# Patient Record
Sex: Female | Born: 1960 | Race: White | Hispanic: No | Marital: Married | Smoking: Former smoker
Health system: Southern US, Community
[De-identification: ages and names within clinical notes are randomized; demographics above are authoritative.]

## PROBLEM LIST (undated history)

## (undated) DIAGNOSIS — E0500 Thyrotoxicosis with diffuse goiter without thyrotoxic crisis or storm: Secondary | ICD-10-CM

## (undated) DIAGNOSIS — K529 Noninfective gastroenteritis and colitis, unspecified: Secondary | ICD-10-CM

## (undated) HISTORY — PX: ABDOMINAL HYSTERECTOMY: SHX81

## (undated) HISTORY — PX: OTHER SURGICAL HISTORY: SHX169

---

## 1997-11-23 ENCOUNTER — Other Ambulatory Visit: Admission: RE | Admit: 1997-11-23 | Discharge: 1997-11-23 | Payer: Self-pay | Admitting: Internal Medicine

## 1999-08-07 ENCOUNTER — Encounter: Admission: RE | Admit: 1999-08-07 | Discharge: 1999-11-05 | Payer: Self-pay | Admitting: *Deleted

## 1999-11-06 ENCOUNTER — Encounter: Payer: Self-pay | Admitting: Orthopedic Surgery

## 1999-11-06 ENCOUNTER — Encounter: Admission: RE | Admit: 1999-11-06 | Discharge: 2000-02-04 | Payer: Self-pay | Admitting: Orthopedic Surgery

## 1999-11-08 ENCOUNTER — Encounter: Admission: RE | Admit: 1999-11-08 | Discharge: 2000-02-06 | Payer: Self-pay | Admitting: *Deleted

## 2000-10-07 ENCOUNTER — Encounter: Admission: RE | Admit: 2000-10-07 | Discharge: 2001-01-05 | Payer: Self-pay | Admitting: *Deleted

## 2001-08-26 ENCOUNTER — Encounter: Admission: RE | Admit: 2001-08-26 | Discharge: 2001-11-24 | Payer: Self-pay | Admitting: Internal Medicine

## 2001-12-15 ENCOUNTER — Encounter: Admission: RE | Admit: 2001-12-15 | Discharge: 2002-02-23 | Payer: Self-pay | Admitting: Internal Medicine

## 2008-03-25 ENCOUNTER — Emergency Department (HOSPITAL_BASED_OUTPATIENT_CLINIC_OR_DEPARTMENT_OTHER): Admission: EM | Admit: 2008-03-25 | Discharge: 2008-03-25 | Payer: Self-pay | Admitting: Emergency Medicine

## 2008-12-01 DIAGNOSIS — E890 Postprocedural hypothyroidism: Secondary | ICD-10-CM | POA: Insufficient documentation

## 2009-11-20 DIAGNOSIS — E7800 Pure hypercholesterolemia, unspecified: Secondary | ICD-10-CM | POA: Insufficient documentation

## 2009-11-20 DIAGNOSIS — E109 Type 1 diabetes mellitus without complications: Secondary | ICD-10-CM | POA: Insufficient documentation

## 2010-09-28 NOTE — Consult Note (Signed)
[place]. [facility]  Patient:    Jody Cooke, Jody Cooke                            MRN: [id number] Proc. Date: 11/06/99 Attending:  Barry Dienes. Eloise Harman, M.D. Dictator:   [id number] CC:         Fritzi Mandes, M.D.                          Consultation Report  REASON FOR CONSULTATION: Right foot pain in a patient with diabetes mellitus.  HISTORY OF PRESENT ILLNESS: The patient is a 50 year old white female with diabetes mellitus, type 1, since approximately 1965.  For the past six months she has complained of pain in the right heel that is worse when first waking up in the morning and worse with walking, particularly on bare feet.  She had been seeing a chiropractor, whose treatment helped a bit.  She has not been taking any anti-inflammatory medications for this condition.  PAST MEDICAL HISTORY:  1. Graves disease, status post radioactive iodine treatment.  2. Irritable bowel syndrome.  3. Pudendal nerve injury.  4. Hypothyroidism.  5. Hyperlipidemia.  PAST SURGICAL HISTORY:  1. Facial cyst removal in 1982.  2. Exploratory laparotomy for endometriosis, approximately 1990.  3. Left shoulder surgery approximately 1990.  4. Left eyelid surgery due to Graves disease in 1996.  5. Colonoscopy in June 2001.  ALLERGIES:  1. ASPIRIN.  2. PENICILLIN.  3. MULTIPLE AERO-ALLERGENS.  4. SEVERAL FOODS.  MEDICATIONS:  1. Humulin NPH at bedtime, with sliding-scale Humalog insulin t.i.d. with     meals.  2. Synthroid 0.2 mg p.o. q.d.  3. Paxil 10 mg p.o. q.d.  4. Hyoscyamine 0.125 mg p.o. q.d. p.r.n. abdominal pain.  5. Compazine 10 mg p.o. q.d. p.r.n. nausea and vomiting.  6. Darvocet p.r.n. pain.  7. Lipitor 10 mg p.o. q.d.  8. Centrum multivitamins 2 tablets p.o. q.d.  9. - eyedrops q.d.  PHYSICAL EXAMINATION: The shape and temperature of both feet are normal. Dorsalis pedis pulses are normal bilaterally, although posterior tibial pulses are nonpalpable.  There is  onychomycosis effecting multiple toenails.  There is no evidence of ingrown toenail.  There is pain in the medial plantar aspect of the right heel that is worse with deep palpation and consistent with plantar fasciitis.  RECOMMENDATIONS:  1. X-ray of the right heel to rule out a stress fracture was done but showed     no evidence of fracture.  2. She was given instructions on proper means of stretching the plantar     fascia.  3. She was advised to purchase heel cups at a sporting good store to lessen     the pressure on the plantar fascia.  4. She was also recommended to consider putting comfortable walking shoes at     her bedside to be put on as soon as she wakes up.  5. If her symptoms persist or worsen with the above, then she was advised to     consider a local steroid injection into the plantar fascia. DD:  11/06/99 TD:  11/06/99 Job: [id number]

## 2011-02-13 LAB — GLUCOSE, CAPILLARY: Glucose-Capillary: 246 — ABNORMAL HIGH

## 2011-04-11 ENCOUNTER — Emergency Department (HOSPITAL_BASED_OUTPATIENT_CLINIC_OR_DEPARTMENT_OTHER)
Admission: EM | Admit: 2011-04-11 | Discharge: 2011-04-11 | Disposition: A | Discharge status: Home / Self Care | Payer: PRIVATE HEALTH INSURANCE | Attending: Emergency Medicine | Admitting: Emergency Medicine

## 2011-04-11 DIAGNOSIS — Z79899 Other long term (current) drug therapy: Secondary | ICD-10-CM | POA: Insufficient documentation

## 2011-04-11 DIAGNOSIS — S91209A Unspecified open wound of unspecified toe(s) with damage to nail, initial encounter: Secondary | ICD-10-CM

## 2011-04-11 DIAGNOSIS — S91109A Unspecified open wound of unspecified toe(s) without damage to nail, initial encounter: Secondary | ICD-10-CM | POA: Insufficient documentation

## 2011-04-11 DIAGNOSIS — R197 Diarrhea, unspecified: Secondary | ICD-10-CM | POA: Insufficient documentation

## 2011-04-11 DIAGNOSIS — L602 Onychogryphosis: Secondary | ICD-10-CM

## 2011-04-11 DIAGNOSIS — E119 Type 2 diabetes mellitus without complications: Secondary | ICD-10-CM | POA: Insufficient documentation

## 2011-04-11 DIAGNOSIS — L608 Other nail disorders: Secondary | ICD-10-CM | POA: Insufficient documentation

## 2011-04-11 DIAGNOSIS — X58XXXA Exposure to other specified factors, initial encounter: Secondary | ICD-10-CM | POA: Insufficient documentation

## 2011-04-11 HISTORY — DX: Thyrotoxicosis with diffuse goiter without thyrotoxic crisis or storm: E05.00

## 2011-04-11 HISTORY — DX: Noninfective gastroenteritis and colitis, unspecified: K52.9

## 2011-04-11 MED ORDER — HYDROCODONE-ACETAMINOPHEN 5-325 MG PO TABS: 2.0000 | ORAL_TABLET | ORAL | Status: AC | PRN

## 2011-04-11 MED ORDER — LIDOCAINE HCL 2 % IJ SOLN
INTRAMUSCULAR | Status: AC
  Filled 2011-04-11: qty 1

## 2011-04-11 MED ORDER — LIDOCAINE HCL 2 % IJ SOLN
20.0000 mL | Freq: Once | INTRAMUSCULAR | Status: AC
  Administered 2011-04-11: 400 mg via INTRADERMAL

## 2011-04-11 MED ORDER — CEPHALEXIN 500 MG PO CAPS: 500.0000 mg | ORAL_CAPSULE | Freq: Four times a day (QID) | ORAL | Status: AC

## 2011-04-11 NOTE — ED Provider Notes (Signed)
History     CSN: [id number] Arrival date & time: 04/11/2011  5:45 PM   First MD Initiated Contact with Patient 04/11/11 1804      Chief Complaint  Patient presents with  . Nail Problem    (Consider location/radiation/quality/duration/timing/severity/associated sxs/prior treatment) Patient is a 50 y.o. female presenting with toe pain. The history is provided by the patient. No language interpreter was used.  Toe Pain This is a chronic problem. The current episode started more than 1 month ago. The problem occurs constantly. The problem has been gradually worsening. Pertinent negatives include no fever or joint swelling. The symptoms are aggravated by walking and standing. She has tried rest for the symptoms. The treatment provided no relief.  Pt reports she has had a discolored toenail for over a year.  Pt has been using vicks for fungal infection.  Pt reports she hit toe 2 months ago.  Pt reports now nail is pulling away and is discolored.    Past Medical History  Diagnosis Date  . Diabetes mellitus   . Grave's disease   . Chronic diarrhea     Past Surgical History  Procedure Date  . Eye lid surgery     No family history on file.  History  Substance Use Topics  . Smoking status: Never Smoker   . Smokeless tobacco: Not on file  . Alcohol Use: No    OB History    Grav Para Term Preterm Abortions TAB SAB Ect Mult Living                  Review of Systems  Constitutional: Negative for fever.  Musculoskeletal: Negative for joint swelling and gait problem.  All other systems reviewed and are negative.    Allergies  Aspirin and Penicillins  Home Medications   Current Outpatient Rx  Name Route Sig Dispense Refill  . SUPER B COMPLEX PO Oral Take 6-8 tablets by mouth daily.     Marland Kitchen CETIRIZINE HCL 10 MG PO TABS Oral Take 10 mg by mouth daily.      Marland Kitchen VITAMIN D 2000 UNITS PO TABS Oral Take 2,000 Units by mouth daily.      Marland Kitchen DIPHENOXYLATE-ATROPINE 2.5-0.025 MG PO TABS  Oral Take 1 tablet by mouth 4 (four) times daily as needed. For diarrhea     . IBUPROFEN 200 MG PO CAPS Oral Take 1 capsule by mouth every 6 (six) hours as needed. For cramps    . INSULIN DETEMIR 100 UNIT/ML [place] SOLN Subcutaneous Inject 25.5 Units into the skin daily.     . INSULIN LISPRO (HUMAN) 100 UNIT/ML [place] SOLN Subcutaneous Inject 5-11 Units into the skin 4 (four) times daily -  before meals and at bedtime.     Marland Kitchen LEVOTHYROXINE SODIUM 175 MCG PO TABS Oral Take 175 mcg by mouth daily.      . MOMETASONE FUROATE 0.1 % EX CREA Topical Apply 1 application topically daily.      [name] PO TABS Oral Take 2 tablets by mouth daily.     Marland Kitchen SIMVASTATIN 20 MG PO TABS Oral Take 20 mg by mouth daily.     Marland Kitchen CIPROFLOXACIN HCL PO Oral Take 1 tablet by mouth 2 (two) times daily as needed. For UTI       BP 142/72  Pulse 95  Temp(Src) 98.5 F (36.9 C) (Oral)  Resp 16  Ht 5\' 8"  (1.727 m)  Wt 195 lb (88.451 kg)  BMI 29.65 kg/m2  SpO2  98%  LMP 04/02/2011  Physical Exam  Nursing note and vitals reviewed. Constitutional: She appears well-developed and well-nourished.  Neck: Normal range of motion.  Musculoskeletal:       Partially avulsed nail  Right toe,  Loose hanging.  Yellow discolored  Neurological: She is alert.  Skin: Skin is warm.  Psychiatric: She has a normal mood and affect.    ED Course  NAIL REMOVAL Date/Time: 04/11/2011 7:31 PM Performed by: Langston Masker Authorized by: Langston Masker Consent: Verbal consent obtained. Consent given by: patient Patient understanding: patient states understanding of the procedure being performed Location: right foot Anesthesia: digital block Local anesthetic: lidocaine 2% without epinephrine Patient sedated: no Preparation: skin prepped with Betadine and skin prepped with ChloraPrep Removed nail replaced and anchored: no Patient tolerance: Patient tolerated the procedure well with no immediate complications. Comments: Nail hanging by lateral  aspect,  Nail not attached to nail bed,  No bleeding no trauma,  There is no new nail.     (including critical care time)  Labs Reviewed - No data to display No results found.   No diagnosis found.    MDM     I referred pt to Dr. Charlsie Merles,  I suspect this nail has been traumatized so bad that it will not regrow.  i am going to treat with antibiotics.    Langston Masker, [place] 04/11/11 [phone]

## 2011-04-11 NOTE — ED Notes (Signed)
Right great toe nail injured 2 months ago-hx of fungal infection-c/o white in color with detatched nail noticed last night

## 2011-04-20 NOTE — ED Provider Notes (Signed)
Medical screening examination/treatment/procedure(s) were performed by non-physician practitioner and as supervising physician I was immediately available for consultation/collaboration.   Suzi Roots, MD 04/20/11 [phone]

## 2011-12-23 ENCOUNTER — Emergency Department (HOSPITAL_BASED_OUTPATIENT_CLINIC_OR_DEPARTMENT_OTHER): Payer: Medicare Other

## 2011-12-23 ENCOUNTER — Encounter (HOSPITAL_BASED_OUTPATIENT_CLINIC_OR_DEPARTMENT_OTHER): Payer: Self-pay | Admitting: *Deleted

## 2011-12-23 ENCOUNTER — Emergency Department (HOSPITAL_BASED_OUTPATIENT_CLINIC_OR_DEPARTMENT_OTHER)
Admission: EM | Admit: 2011-12-23 | Discharge: 2011-12-23 | Disposition: A | Discharge status: Home / Self Care | Payer: Medicare Other | Attending: Emergency Medicine | Admitting: Emergency Medicine

## 2011-12-23 DIAGNOSIS — E119 Type 2 diabetes mellitus without complications: Secondary | ICD-10-CM | POA: Insufficient documentation

## 2011-12-23 DIAGNOSIS — R079 Chest pain, unspecified: Secondary | ICD-10-CM | POA: Insufficient documentation

## 2011-12-23 DIAGNOSIS — Z794 Long term (current) use of insulin: Secondary | ICD-10-CM | POA: Insufficient documentation

## 2011-12-23 DIAGNOSIS — R42 Dizziness and giddiness: Secondary | ICD-10-CM | POA: Insufficient documentation

## 2011-12-23 LAB — COMPREHENSIVE METABOLIC PANEL
CO2: 25 mEq/L (ref 19–32)
Calcium: 9.3 mg/dL (ref 8.4–10.5)
Creatinine, Ser: 0.7 mg/dL (ref 0.50–1.10)
GFR calc Af Amer: >90 mL/min (ref >90)
GFR calc non Af Amer: >90 mL/min (ref >90)
Glucose, Bld: 128 mg/dL — ABNORMAL HIGH (ref 70–99)

## 2011-12-23 LAB — CBC
Hemoglobin: 11.4 g/dL — ABNORMAL LOW (ref 12.0–15.0)
MCH: 31.8 pg (ref 26.0–34.0)
MCHC: 33.5 g/dL (ref 30.0–36.0)
MCV: 95 fL (ref 78.0–100.0)
Platelets: 305 10*3/uL (ref 150–400)
RBC: 3.58 MIL/uL — ABNORMAL LOW (ref 3.87–5.11)

## 2011-12-23 NOTE — ED Notes (Signed)
Care assumed

## 2011-12-23 NOTE — ED Provider Notes (Signed)
History  This chart was scribed for Charles B. Bernette Mayers, MD by Ladona Ridgel Day. This patient was seen in room MH01/MH01 and the patient's care was started at 1523.   CSN: [id number]  Arrival date & time 12/23/11  1523   First MD Initiated Contact with Patient 12/23/11 1555      Chief Complaint  Patient presents with  . Chest Pain    The history is provided by the patient. No language interpreter was used.   Jody Cooke is a 51 y.o. female who presents to the Emergency Department complaining of constant mid sternal chest pain/tightness for four days. She states her chest pain is worse with walking around and was max at 7/10 in pain and her pain sometimes makes her dizzy. She states her CP does not radiate and denies any leg swelling or recent long travels. She denies any previous heart history but did have a normal EKG before her D&C procedure one month ago. She denies ever having a stress test or heart catheterization. She is not a smoker. Pain has improved over the course of several days and is absent at the time of my evaluation.   Her PCP is Dr. Claudean Cooke at [facility] Past Medical History  Diagnosis Date  . Diabetes mellitus   . Grave's disease   . Chronic diarrhea     Past Surgical History  Procedure Date  . Eye lid surgery     History reviewed. No pertinent family history.  History  Substance Use Topics  . Smoking status: Never Smoker   . Smokeless tobacco: Not on file  . Alcohol Use: No    OB History    Grav Para Term Preterm Abortions TAB SAB Ect Mult Living                  Review of Systems A complete 10 system review of systems was obtained and all systems are negative except as noted in the HPI and PMH.   Allergies  Aspirin and Penicillins  Home Medications   Current Outpatient Rx  Name Route Sig Dispense Refill  . SUPER B COMPLEX PO Oral Take 6-8 tablets by mouth daily.     Marland Kitchen CETIRIZINE HCL 10 MG PO TABS Oral Take 10 mg by mouth daily.      Marland Kitchen VITAMIN D  2000 UNITS PO TABS Oral Take 2,000 Units by mouth daily.      Marland Kitchen DIPHENOXYLATE-ATROPINE 2.5-0.025 MG PO TABS Oral Take 1 tablet by mouth 4 (four) times daily as needed. For diarrhea     . IBUPROFEN 200 MG PO CAPS Oral Take 1 capsule by mouth every 6 (six) hours as needed. For cramps    . INSULIN DETEMIR 100 UNIT/ML [place] SOLN Subcutaneous Inject 25.5 Units into the skin daily.     . INSULIN LISPRO (HUMAN) 100 UNIT/ML [place] SOLN Subcutaneous Inject 5-11 Units into the skin 4 (four) times daily -  before meals and at bedtime.     Marland Kitchen LEVOTHYROXINE SODIUM 175 MCG PO TABS Oral Take 175 mcg by mouth daily.      . MOMETASONE FUROATE 0.1 % EX CREA Topical Apply 1 application topically daily.      [name] PO TABS Oral Take 2 tablets by mouth daily.     Marland Kitchen SIMVASTATIN 20 MG PO TABS Oral Take 20 mg by mouth daily.       Triage Vitals: BP 143/69  Pulse 104  Temp 98.2 F (36.8 C) (Oral)  Resp 16  Ht 5\' 10"  (1.778 m)  Wt 186 lb (84.369 kg)  BMI 26.69 kg/m2  SpO2 99%  LMP 12/09/2011  Physical Exam  Nursing note and vitals reviewed. Constitutional: She is oriented to person, place, and time. She appears well-developed and well-nourished.  HENT:  Head: Normocephalic and atraumatic.  Eyes: EOM are normal. Pupils are equal, round, and reactive to light.  Neck: Normal range of motion. Neck supple.  Cardiovascular: Normal rate, normal heart sounds and intact distal pulses.   Pulmonary/Chest: Effort normal and breath sounds normal.  Abdominal: Bowel sounds are normal. She exhibits no distension. There is no tenderness.  Musculoskeletal: Normal range of motion. She exhibits no edema and no tenderness.  Neurological: She is alert and oriented to person, place, and time. She has normal strength. No cranial nerve deficit or sensory deficit.  Skin: Skin is warm and dry. No rash noted.  Psychiatric: She has a normal mood and affect.    ED Course  Procedures (including critical care time) DIAGNOSTIC  STUDIES: Oxygen Saturation is 99% on room air, normal by my interpretation.    COORDINATION OF CARE: At 405 PM Discussed treatment plan with patient which includes CXR, heart markers, and blood work. Patient agrees.   Labs Reviewed  CBC - Abnormal; Notable for the following:    RBC 3.58 (*)     Hemoglobin 11.4 (*)     HCT 34.0 (*)     All other components within normal limits  TROPONIN I  COMPREHENSIVE METABOLIC PANEL   No results found.   No diagnosis found.    MDM  I personally performed the services described in the documentation, which were scribed in my presence. The recorded information has been reviewed and considered.   Date: 12/23/2011  Rate: 95  Rhythm: normal sinus rhythm  QRS Axis: normal  Intervals: normal  ST/T Wave abnormalities: normal  Conduction Disutrbances:none  Narrative Interpretation:   Old EKG Reviewed: none available  Discussed case with PCP, Dr. Riley Nearing who agrees with plan for delta troponin in the ED and outpatient followup if neg. Pt has had pain for several days, now resolved, neg enzymes x 2 are effective for rule out. Pt safe for outpatient followup with PCP for further evaluation. Pt amenable to this plan, advised to return to the ED for any worsening pain, or for any other concerns.           Charles B. Bernette Mayers, MD 12/23/11 Rickey Primus

## 2011-12-23 NOTE — ED Notes (Signed)
Pt c/o CP " tightness" x 1 week

## 2015-05-30 DIAGNOSIS — M542 Cervicalgia: Secondary | ICD-10-CM | POA: Diagnosis not present

## 2015-05-30 DIAGNOSIS — M9901 Segmental and somatic dysfunction of cervical region: Secondary | ICD-10-CM | POA: Diagnosis not present

## 2015-06-23 DIAGNOSIS — N3289 Other specified disorders of bladder: Secondary | ICD-10-CM | POA: Diagnosis not present

## 2015-06-27 DIAGNOSIS — M9901 Segmental and somatic dysfunction of cervical region: Secondary | ICD-10-CM | POA: Diagnosis not present

## 2015-06-27 DIAGNOSIS — M542 Cervicalgia: Secondary | ICD-10-CM | POA: Diagnosis not present

## 2015-06-29 DIAGNOSIS — M545 Low back pain: Secondary | ICD-10-CM | POA: Diagnosis not present

## 2015-06-29 DIAGNOSIS — N3000 Acute cystitis without hematuria: Secondary | ICD-10-CM | POA: Diagnosis not present

## 2015-07-20 DIAGNOSIS — E109 Type 1 diabetes mellitus without complications: Secondary | ICD-10-CM | POA: Diagnosis not present

## 2015-07-25 DIAGNOSIS — E890 Postprocedural hypothyroidism: Secondary | ICD-10-CM | POA: Diagnosis not present

## 2015-07-25 DIAGNOSIS — E109 Type 1 diabetes mellitus without complications: Secondary | ICD-10-CM | POA: Diagnosis not present

## 2015-07-25 DIAGNOSIS — E7800 Pure hypercholesterolemia, unspecified: Secondary | ICD-10-CM | POA: Diagnosis not present

## 2015-09-05 DIAGNOSIS — S8992XA Unspecified injury of left lower leg, initial encounter: Secondary | ICD-10-CM | POA: Diagnosis not present

## 2015-09-05 DIAGNOSIS — M79672 Pain in left foot: Secondary | ICD-10-CM | POA: Diagnosis not present

## 2015-09-05 DIAGNOSIS — M25561 Pain in right knee: Secondary | ICD-10-CM | POA: Diagnosis not present

## 2015-09-11 DIAGNOSIS — M25561 Pain in right knee: Secondary | ICD-10-CM | POA: Diagnosis not present

## 2015-09-11 DIAGNOSIS — G5761 Lesion of plantar nerve, right lower limb: Secondary | ICD-10-CM | POA: Diagnosis not present

## 2015-09-11 DIAGNOSIS — M7061 Trochanteric bursitis, right hip: Secondary | ICD-10-CM | POA: Diagnosis not present

## 2015-09-11 DIAGNOSIS — M79672 Pain in left foot: Secondary | ICD-10-CM | POA: Diagnosis not present

## 2015-09-28 DIAGNOSIS — K6289 Other specified diseases of anus and rectum: Secondary | ICD-10-CM | POA: Diagnosis not present

## 2015-09-28 DIAGNOSIS — K529 Noninfective gastroenteritis and colitis, unspecified: Secondary | ICD-10-CM | POA: Diagnosis not present

## 2015-09-29 DIAGNOSIS — M25561 Pain in right knee: Secondary | ICD-10-CM | POA: Diagnosis not present

## 2015-09-29 DIAGNOSIS — N390 Urinary tract infection, site not specified: Secondary | ICD-10-CM | POA: Diagnosis not present

## 2015-10-10 DIAGNOSIS — H524 Presbyopia: Secondary | ICD-10-CM | POA: Diagnosis not present

## 2015-10-10 DIAGNOSIS — E109 Type 1 diabetes mellitus without complications: Secondary | ICD-10-CM | POA: Diagnosis not present

## 2015-10-10 DIAGNOSIS — H2513 Age-related nuclear cataract, bilateral: Secondary | ICD-10-CM | POA: Diagnosis not present

## 2015-10-24 DIAGNOSIS — M9911 Subluxation complex (vertebral) of cervical region: Secondary | ICD-10-CM | POA: Diagnosis not present

## 2015-10-24 DIAGNOSIS — M25552 Pain in left hip: Secondary | ICD-10-CM | POA: Diagnosis not present

## 2015-10-24 DIAGNOSIS — M9903 Segmental and somatic dysfunction of lumbar region: Secondary | ICD-10-CM | POA: Diagnosis not present

## 2015-10-24 DIAGNOSIS — M62830 Muscle spasm of back: Secondary | ICD-10-CM | POA: Diagnosis not present

## 2015-10-24 DIAGNOSIS — M542 Cervicalgia: Secondary | ICD-10-CM | POA: Diagnosis not present

## 2015-10-24 DIAGNOSIS — M25551 Pain in right hip: Secondary | ICD-10-CM | POA: Diagnosis not present

## 2015-10-26 DIAGNOSIS — M542 Cervicalgia: Secondary | ICD-10-CM | POA: Diagnosis not present

## 2015-10-26 DIAGNOSIS — M62830 Muscle spasm of back: Secondary | ICD-10-CM | POA: Diagnosis not present

## 2015-10-26 DIAGNOSIS — M25551 Pain in right hip: Secondary | ICD-10-CM | POA: Diagnosis not present

## 2015-10-26 DIAGNOSIS — M25552 Pain in left hip: Secondary | ICD-10-CM | POA: Diagnosis not present

## 2015-10-26 DIAGNOSIS — M9911 Subluxation complex (vertebral) of cervical region: Secondary | ICD-10-CM | POA: Diagnosis not present

## 2015-10-26 DIAGNOSIS — M9903 Segmental and somatic dysfunction of lumbar region: Secondary | ICD-10-CM | POA: Diagnosis not present

## 2015-11-02 DIAGNOSIS — M62830 Muscle spasm of back: Secondary | ICD-10-CM | POA: Diagnosis not present

## 2015-11-02 DIAGNOSIS — M25552 Pain in left hip: Secondary | ICD-10-CM | POA: Diagnosis not present

## 2015-11-02 DIAGNOSIS — M9903 Segmental and somatic dysfunction of lumbar region: Secondary | ICD-10-CM | POA: Diagnosis not present

## 2015-11-02 DIAGNOSIS — M9911 Subluxation complex (vertebral) of cervical region: Secondary | ICD-10-CM | POA: Diagnosis not present

## 2015-11-02 DIAGNOSIS — M25551 Pain in right hip: Secondary | ICD-10-CM | POA: Diagnosis not present

## 2015-11-02 DIAGNOSIS — M542 Cervicalgia: Secondary | ICD-10-CM | POA: Diagnosis not present

## 2015-11-07 DIAGNOSIS — E109 Type 1 diabetes mellitus without complications: Secondary | ICD-10-CM | POA: Diagnosis not present

## 2015-11-07 DIAGNOSIS — E890 Postprocedural hypothyroidism: Secondary | ICD-10-CM | POA: Diagnosis not present

## 2015-11-07 DIAGNOSIS — E7800 Pure hypercholesterolemia, unspecified: Secondary | ICD-10-CM | POA: Diagnosis not present

## 2015-11-09 DIAGNOSIS — M25552 Pain in left hip: Secondary | ICD-10-CM | POA: Diagnosis not present

## 2015-11-09 DIAGNOSIS — M9911 Subluxation complex (vertebral) of cervical region: Secondary | ICD-10-CM | POA: Diagnosis not present

## 2015-11-09 DIAGNOSIS — M542 Cervicalgia: Secondary | ICD-10-CM | POA: Diagnosis not present

## 2015-11-09 DIAGNOSIS — M25551 Pain in right hip: Secondary | ICD-10-CM | POA: Diagnosis not present

## 2015-11-09 DIAGNOSIS — M9903 Segmental and somatic dysfunction of lumbar region: Secondary | ICD-10-CM | POA: Diagnosis not present

## 2015-11-09 DIAGNOSIS — M62830 Muscle spasm of back: Secondary | ICD-10-CM | POA: Diagnosis not present

## 2015-11-16 DIAGNOSIS — M5416 Radiculopathy, lumbar region: Secondary | ICD-10-CM | POA: Diagnosis not present

## 2015-11-16 DIAGNOSIS — M545 Low back pain: Secondary | ICD-10-CM | POA: Diagnosis not present

## 2015-11-28 DIAGNOSIS — Z1231 Encounter for screening mammogram for malignant neoplasm of breast: Secondary | ICD-10-CM | POA: Diagnosis not present

## 2015-11-30 DIAGNOSIS — M542 Cervicalgia: Secondary | ICD-10-CM | POA: Diagnosis not present

## 2015-11-30 DIAGNOSIS — M25551 Pain in right hip: Secondary | ICD-10-CM | POA: Diagnosis not present

## 2015-11-30 DIAGNOSIS — M9911 Subluxation complex (vertebral) of cervical region: Secondary | ICD-10-CM | POA: Diagnosis not present

## 2015-11-30 DIAGNOSIS — M25552 Pain in left hip: Secondary | ICD-10-CM | POA: Diagnosis not present

## 2015-11-30 DIAGNOSIS — M9903 Segmental and somatic dysfunction of lumbar region: Secondary | ICD-10-CM | POA: Diagnosis not present

## 2015-11-30 DIAGNOSIS — M62830 Muscle spasm of back: Secondary | ICD-10-CM | POA: Diagnosis not present

## 2015-12-08 DIAGNOSIS — R159 Full incontinence of feces: Secondary | ICD-10-CM | POA: Diagnosis not present

## 2015-12-08 DIAGNOSIS — N941 Unspecified dyspareunia: Secondary | ICD-10-CM | POA: Diagnosis not present

## 2015-12-08 DIAGNOSIS — N390 Urinary tract infection, site not specified: Secondary | ICD-10-CM | POA: Diagnosis not present

## 2015-12-21 DIAGNOSIS — N390 Urinary tract infection, site not specified: Secondary | ICD-10-CM | POA: Diagnosis not present

## 2016-01-11 DIAGNOSIS — M25551 Pain in right hip: Secondary | ICD-10-CM | POA: Diagnosis not present

## 2016-01-11 DIAGNOSIS — M9911 Subluxation complex (vertebral) of cervical region: Secondary | ICD-10-CM | POA: Diagnosis not present

## 2016-01-11 DIAGNOSIS — M542 Cervicalgia: Secondary | ICD-10-CM | POA: Diagnosis not present

## 2016-01-11 DIAGNOSIS — M25552 Pain in left hip: Secondary | ICD-10-CM | POA: Diagnosis not present

## 2016-01-11 DIAGNOSIS — M9903 Segmental and somatic dysfunction of lumbar region: Secondary | ICD-10-CM | POA: Diagnosis not present

## 2016-01-11 DIAGNOSIS — M62830 Muscle spasm of back: Secondary | ICD-10-CM | POA: Diagnosis not present

## 2016-01-26 DIAGNOSIS — N390 Urinary tract infection, site not specified: Secondary | ICD-10-CM | POA: Diagnosis not present

## 2016-01-26 DIAGNOSIS — B373 Candidiasis of vulva and vagina: Secondary | ICD-10-CM | POA: Diagnosis not present

## 2016-01-26 DIAGNOSIS — N952 Postmenopausal atrophic vaginitis: Secondary | ICD-10-CM | POA: Diagnosis not present

## 2016-02-15 DIAGNOSIS — M9903 Segmental and somatic dysfunction of lumbar region: Secondary | ICD-10-CM | POA: Diagnosis not present

## 2016-02-15 DIAGNOSIS — M62830 Muscle spasm of back: Secondary | ICD-10-CM | POA: Diagnosis not present

## 2016-02-15 DIAGNOSIS — M542 Cervicalgia: Secondary | ICD-10-CM | POA: Diagnosis not present

## 2016-02-15 DIAGNOSIS — M25551 Pain in right hip: Secondary | ICD-10-CM | POA: Diagnosis not present

## 2016-02-15 DIAGNOSIS — M9911 Subluxation complex (vertebral) of cervical region: Secondary | ICD-10-CM | POA: Diagnosis not present

## 2016-02-15 DIAGNOSIS — M25552 Pain in left hip: Secondary | ICD-10-CM | POA: Diagnosis not present

## 2016-02-20 DIAGNOSIS — E890 Postprocedural hypothyroidism: Secondary | ICD-10-CM | POA: Diagnosis not present

## 2016-02-20 DIAGNOSIS — E7800 Pure hypercholesterolemia, unspecified: Secondary | ICD-10-CM | POA: Diagnosis not present

## 2016-02-20 DIAGNOSIS — E109 Type 1 diabetes mellitus without complications: Secondary | ICD-10-CM | POA: Diagnosis not present

## 2016-03-03 ENCOUNTER — Encounter (HOSPITAL_BASED_OUTPATIENT_CLINIC_OR_DEPARTMENT_OTHER): Payer: Self-pay | Admitting: *Deleted

## 2016-03-03 ENCOUNTER — Emergency Department (HOSPITAL_BASED_OUTPATIENT_CLINIC_OR_DEPARTMENT_OTHER)
Admission: EM | Admit: 2016-03-03 | Discharge: 2016-03-03 | Disposition: A | Discharge status: Home / Self Care | Payer: PPO | Attending: Emergency Medicine | Admitting: Emergency Medicine

## 2016-03-03 DIAGNOSIS — R197 Diarrhea, unspecified: Secondary | ICD-10-CM | POA: Diagnosis not present

## 2016-03-03 DIAGNOSIS — R110 Nausea: Secondary | ICD-10-CM

## 2016-03-03 LAB — CBC WITH DIFFERENTIAL/PLATELET
BASOS ABS: 0 10*3/uL (ref 0.0–0.1)
BASOS PCT: 0 %
Eosinophils Absolute: 0.2 10*3/uL (ref 0.0–0.7)
Eosinophils Relative: 3 %
HEMATOCRIT: 34.3 % — AB (ref 36.0–46.0)
HEMOGLOBIN: 11.7 g/dL — AB (ref 12.0–15.0)
LYMPHS PCT: 14 %
Lymphs Abs: 1.1 10*3/uL (ref 0.7–4.0)
MCH: 32.7 pg (ref 26.0–34.0)
MCHC: 34.1 g/dL (ref 30.0–36.0)
MCV: 95.8 fL (ref 78.0–100.0)
MONO ABS: 0.8 10*3/uL (ref 0.1–1.0)
MONOS PCT: 10 %
NEUTROS ABS: 6.1 10*3/uL (ref 1.7–7.7)
NEUTROS PCT: 73 %
Platelets: 277 10*3/uL (ref 150–400)
RBC: 3.58 MIL/uL — ABNORMAL LOW (ref 3.87–5.11)
RDW: 11.7 % (ref 11.5–15.5)
WBC: 8.4 10*3/uL (ref 4.0–10.5)

## 2016-03-03 LAB — COMPREHENSIVE METABOLIC PANEL
ALBUMIN: 4.1 g/dL (ref 3.5–5.0)
ALT: 14 U/L (ref 14–54)
ANION GAP: 10 (ref 5–15)
AST: 18 U/L (ref 15–41)
Alkaline Phosphatase: 58 U/L (ref 38–126)
BUN: 8 mg/dL (ref 6–20)
CHLORIDE: 100 mmol/L — AB (ref 101–111)
CO2: 21 mmol/L — ABNORMAL LOW (ref 22–32)
Calcium: 9.4 mg/dL (ref 8.9–10.3)
Creatinine, Ser: 0.76 mg/dL (ref 0.44–1.00)
GFR calc Af Amer: >60 mL/min (ref >60)
GFR calc non Af Amer: >60 mL/min (ref >60)
GLUCOSE: 237 mg/dL — AB (ref 65–99)
POTASSIUM: 3.5 mmol/L (ref 3.5–5.1)
Sodium: 131 mmol/L — ABNORMAL LOW (ref 135–145)
TOTAL PROTEIN: 7.1 g/dL (ref 6.5–8.1)
Total Bilirubin: 0.7 mg/dL (ref 0.3–1.2)

## 2016-03-03 LAB — URINALYSIS, ROUTINE W REFLEX MICROSCOPIC
Bilirubin Urine: NEGATIVE
GLUCOSE, UA: 250 mg/dL — AB
Hgb urine dipstick: NEGATIVE
Ketones, ur: 40 mg/dL — AB
NITRITE: NEGATIVE
PH: 6 (ref 5.0–8.0)
Protein, ur: NEGATIVE mg/dL
SPECIFIC GRAVITY, URINE: 1.004 — AB (ref 1.005–1.030)

## 2016-03-03 LAB — LIPASE, BLOOD: LIPASE: 12 U/L (ref 11–51)

## 2016-03-03 LAB — URINE MICROSCOPIC-ADD ON

## 2016-03-03 MED ORDER — PROMETHAZINE HCL 25 MG RE SUPP: 25.0000 mg | Freq: Four times a day (QID) | RECTAL | 0 refills | Status: DC | PRN

## 2016-03-03 MED ORDER — ONDANSETRON 4 MG PO TBDP: 4.0000 mg | ORAL_TABLET | Freq: Three times a day (TID) | ORAL | 0 refills | Status: DC | PRN

## 2016-03-03 MED ORDER — SODIUM CHLORIDE 0.9 % IV BOLUS (SEPSIS)
1000.0000 mL | Freq: Once | INTRAVENOUS | Status: AC
  Administered 2016-03-03: 1000 mL via INTRAVENOUS

## 2016-03-03 MED ORDER — ONDANSETRON HCL 4 MG/2ML IJ SOLN
4.0000 mg | Freq: Once | INTRAMUSCULAR | Status: AC
  Administered 2016-03-03: 4 mg via INTRAVENOUS
  Filled 2016-03-03: qty 2

## 2016-03-03 NOTE — ED Triage Notes (Signed)
Pt states a history of chronic diarrhea that started Monday. States she was recently started on Premarin. C/o nausea 2 hours ago but states this has resolved. Denies any vomiting. States she has a hx of diabetes. Last blood sugar was 233 at 2300 last night. Pt took her scheduled insulin and novolog.

## 2016-03-03 NOTE — Discharge Instructions (Signed)
You have been seen in the Emergency Department (ED) today for nausea and diarrhea  Your work up today has not shown a clear cause for your symptoms. You have been prescribed Zofran; please use as prescribed as needed for your nausea.  Follow up with your doctor as soon as possible regarding today?s emergent visit and your symptoms of nausea.   Return to the ED if you develop abdominal, bloody vomiting, bloody diarrhea, if you are unable to tolerate fluids due to vomiting, or if you develop other symptoms that concern you.

## 2016-03-03 NOTE — ED Provider Notes (Signed)
Emergency Department Provider Note   I have reviewed the triage vital signs and the nursing notes.   HISTORY  Chief Complaint Diarrhea   HPI Jody Cooke is a 55 y.o. female with PMH of chronic diarrhea, DM, and Grave's Disease presents to the emergency department for evaluation of acute on chronic diarrhea. Patient states that she has a long history of diarrhea but that symptoms worsened 5 days prior. She denies any fever, chills, abdominal discomfort, bloody diarrhea. No travel history. No known sick contacts. She tried her diarrhea medications at home with intermittent relief. She denies any associated chest pain, difficulty breathing, lightheadedness. She denies any lower back pain. She is having some fecal incontinence which is concerning for her. No dysuria, hesitancy, urgency.   Past Medical History:  Diagnosis Date  . Chronic diarrhea   . Diabetes mellitus   . Grave's disease     There are no active problems to display for this patient.   Past Surgical History:  Procedure Laterality Date  . ABDOMINAL HYSTERECTOMY    . eye lid surgery      Current Outpatient Rx  . Order #: [id number] Class: Historical Med  . Order #: [id number] Class: Historical Med  . Order #: [id number] Class: Historical Med  . Order #: [id number] Class: Historical Med  . Order #: [id number] Class: Historical Med  . Order #: [id number] Class: Historical Med  . Order #: [id number] Class: Historical Med  . Order #: [id number] Class: Historical Med  . Order #: [id number] Class: Historical Med  . Order #: [id number] Class: Historical Med  . Order #: [id number] Class: Historical Med  . Order #: [id number] Class: Print  . Order #: [id number] Class: Print  . Order #[id number] Class: Historical Med    Allergies Aspirin and Penicillins  No family history on file.  Social History Social History  Substance Use Topics  . Smoking status: Never Smoker  . Smokeless tobacco: Never Used  . Alcohol use No    Review of  Systems  Constitutional: No fever/chills Eyes: No visual changes. ENT: No sore throat. Cardiovascular: Denies chest pain. Respiratory: Denies shortness of breath. Gastrointestinal: No abdominal pain. Positive nausea, no vomiting. Positive diarrhea.  No constipation. Genitourinary: Negative for dysuria. Musculoskeletal: Negative for back pain. Skin: Negative for rash. Neurological: Negative for headaches, focal weakness or numbness.  10-point ROS otherwise negative.  ____________________________________________   PHYSICAL EXAM:  VITAL SIGNS: ED Triage Vitals  Enc Vitals Group     BP 03/03/16 0139 161/87     Pulse Rate 03/03/16 0139 100     Resp 03/03/16 0139 18     Temp 03/03/16 0139 98.1 F (36.7 C)     SpO2 03/03/16 0139 97 %     Weight 03/03/16 0143 185 lb (83.9 kg)     Height 03/03/16 0143 5\' 9"  (1.753 m)   Constitutional: Alert and oriented. Well appearing and in no acute distress. Eyes: Conjunctivae are normal.  Head: Atraumatic. Nose: No congestion/rhinnorhea. Mouth/Throat: Mucous membranes are moist.  Oropharynx non-erythematous. Neck: No stridor.   Cardiovascular: Normal rate, regular rhythm. Good peripheral circulation. Grossly normal heart sounds.   Respiratory: Normal respiratory effort.  No retractions. Lungs CTAB. Gastrointestinal: Soft and nontender. No distention.  Musculoskeletal: No lower extremity tenderness nor edema. No gross deformities of extremities. No midline spine tenderness  Neurologic:  Normal speech and language. No gross focal neurologic deficits are appreciated. Normal gait.  Skin:  Skin is warm, dry and intact. No rash noted. Psychiatric: Mood and affect are normal. Speech and behavior are normal.  ____________________________________________   LABS (all labs ordered are listed, but only abnormal results are displayed)  Labs Reviewed  COMPREHENSIVE METABOLIC PANEL - Abnormal; Notable for the following:       Result Value   Sodium  131 (*)    Chloride 100 (*)    CO2 21 (*)    Glucose, Bld 237 (*)    All other components within normal limits  CBC WITH DIFFERENTIAL/PLATELET - Abnormal; Notable for the following:    RBC 3.58 (*)    Hemoglobin 11.7 (*)    HCT 34.3 (*)    All other components within normal limits  URINALYSIS, ROUTINE W REFLEX MICROSCOPIC (NOT AT [facility]) - Abnormal; Notable for the following:    Specific Gravity, Urine 1.004 (*)    Glucose, UA 250 (*)    Ketones, ur 40 (*)    Leukocytes, UA TRACE (*)    All other components within normal limits  URINE MICROSCOPIC-ADD ON - Abnormal; Notable for the following:    Squamous Epithelial / LPF 0-5 (*)    Bacteria, UA RARE (*)    All other components within normal limits  LIPASE, BLOOD   ____________________________________________  RADIOLOGY  None ____________________________________________   PROCEDURES  Procedure(s) performed:   Procedures  None ____________________________________________   INITIAL IMPRESSION / ASSESSMENT AND PLAN / ED COURSE  Pertinent labs & imaging results that were available during my care of the patient were reviewed by me and considered in my medical decision making (see chart for details).  Patient resents to the emergency room in for evaluation of acute on chronic diarrhea. She is having some fecal incontinence. No evidence of spinal cord injury or severe stenosis. The patient is a mandatory the emergency department without difficulty. She has a long history of chronic diarrhea. No other concerning signs or symptoms of invasive diarrhea etiology. Plan to check electrolytes, give IV fluid hydration, treatment nausea. Abdomen is soft nontender to palpation.   05:14 AM Patient is feeling much better after Zofran and IV fluids. Labs with no significant electrolyte abnormality requires correction in the emergency department. Plan for close primary care physician follow-up. We'll discharge home with Zofran ODT.   At this  time, I do not feel there is any life-threatening condition present. I have reviewed and discussed all results (EKG, imaging, lab, urine as appropriate), exam findings with patient. I have reviewed nursing notes and appropriate previous records.  I feel the patient is safe to be discharged home without further emergent workup. Discussed usual and customary return precautions. Patient and family (if present) verbalize understanding and are comfortable with this plan.  Patient will follow-up with their primary care provider. If they do not have a primary care provider, information for follow-up has been provided to them. All questions have been answered.  ____________________________________________  FINAL CLINICAL IMPRESSION(S) / ED DIAGNOSES  Final diagnoses:  Diarrhea, unspecified type  Nausea     MEDICATIONS GIVEN DURING THIS VISIT:  Medications  sodium chloride 0.9 % bolus 1,000 mL (0 mLs Intravenous Stopped 03/03/16 0556)  ondansetron (ZOFRAN) injection 4 mg (4 mg Intravenous Given 03/03/16 0310)     NEW OUTPATIENT MEDICATIONS STARTED DURING THIS VISIT:  Discharge Medication List as of 03/03/2016  5:16 AM    START taking these medications   Details  ondansetron (ZOFRAN ODT) 4 MG disintegrating tablet Take 1 tablet (4 mg total) by mouth every 8 (eight) hours as needed for nausea or vomiting., Starting Sun 03/03/2016, Print    promethazine (PHENERGAN)  25 MG suppository Place 1 suppository (25 mg total) rectally every 6 (six) hours as needed for nausea or vomiting., Starting Sun 03/03/2016, Print          Note:  This document was prepared using Dragon voice recognition software and may include unintentional dictation errors.  Nanda Quinton, MD Emergency Medicine   Margette Fast, MD 03/03/16 [phone]

## 2016-03-19 DIAGNOSIS — M25551 Pain in right hip: Secondary | ICD-10-CM | POA: Diagnosis not present

## 2016-03-19 DIAGNOSIS — M9903 Segmental and somatic dysfunction of lumbar region: Secondary | ICD-10-CM | POA: Diagnosis not present

## 2016-03-19 DIAGNOSIS — M9911 Subluxation complex (vertebral) of cervical region: Secondary | ICD-10-CM | POA: Diagnosis not present

## 2016-03-19 DIAGNOSIS — M25552 Pain in left hip: Secondary | ICD-10-CM | POA: Diagnosis not present

## 2016-03-19 DIAGNOSIS — M62830 Muscle spasm of back: Secondary | ICD-10-CM | POA: Diagnosis not present

## 2016-03-19 DIAGNOSIS — M542 Cervicalgia: Secondary | ICD-10-CM | POA: Diagnosis not present

## 2016-04-16 DIAGNOSIS — M9903 Segmental and somatic dysfunction of lumbar region: Secondary | ICD-10-CM | POA: Diagnosis not present

## 2016-04-16 DIAGNOSIS — M62830 Muscle spasm of back: Secondary | ICD-10-CM | POA: Diagnosis not present

## 2016-04-16 DIAGNOSIS — M25552 Pain in left hip: Secondary | ICD-10-CM | POA: Diagnosis not present

## 2016-04-16 DIAGNOSIS — M9911 Subluxation complex (vertebral) of cervical region: Secondary | ICD-10-CM | POA: Diagnosis not present

## 2016-04-16 DIAGNOSIS — M542 Cervicalgia: Secondary | ICD-10-CM | POA: Diagnosis not present

## 2016-04-16 DIAGNOSIS — M25551 Pain in right hip: Secondary | ICD-10-CM | POA: Diagnosis not present

## 2016-04-22 DIAGNOSIS — K591 Functional diarrhea: Secondary | ICD-10-CM | POA: Diagnosis not present

## 2016-04-22 DIAGNOSIS — K580 Irritable bowel syndrome with diarrhea: Secondary | ICD-10-CM | POA: Diagnosis not present

## 2016-05-03 DIAGNOSIS — E039 Hypothyroidism, unspecified: Secondary | ICD-10-CM | POA: Diagnosis not present

## 2016-05-14 DIAGNOSIS — M542 Cervicalgia: Secondary | ICD-10-CM | POA: Diagnosis not present

## 2016-05-14 DIAGNOSIS — M9911 Subluxation complex (vertebral) of cervical region: Secondary | ICD-10-CM | POA: Diagnosis not present

## 2016-05-14 DIAGNOSIS — M25551 Pain in right hip: Secondary | ICD-10-CM | POA: Diagnosis not present

## 2016-05-14 DIAGNOSIS — M62830 Muscle spasm of back: Secondary | ICD-10-CM | POA: Diagnosis not present

## 2016-05-14 DIAGNOSIS — M25552 Pain in left hip: Secondary | ICD-10-CM | POA: Diagnosis not present

## 2016-05-14 DIAGNOSIS — M9903 Segmental and somatic dysfunction of lumbar region: Secondary | ICD-10-CM | POA: Diagnosis not present

## 2016-06-07 DIAGNOSIS — N390 Urinary tract infection, site not specified: Secondary | ICD-10-CM | POA: Diagnosis not present

## 2016-06-07 DIAGNOSIS — R159 Full incontinence of feces: Secondary | ICD-10-CM | POA: Diagnosis not present

## 2016-06-07 DIAGNOSIS — N952 Postmenopausal atrophic vaginitis: Secondary | ICD-10-CM | POA: Diagnosis not present

## 2016-06-07 DIAGNOSIS — N941 Unspecified dyspareunia: Secondary | ICD-10-CM | POA: Diagnosis not present

## 2016-06-13 DIAGNOSIS — Z01419 Encounter for gynecological examination (general) (routine) without abnormal findings: Secondary | ICD-10-CM | POA: Diagnosis not present

## 2016-06-20 DIAGNOSIS — M62830 Muscle spasm of back: Secondary | ICD-10-CM | POA: Diagnosis not present

## 2016-06-20 DIAGNOSIS — M9903 Segmental and somatic dysfunction of lumbar region: Secondary | ICD-10-CM | POA: Diagnosis not present

## 2016-06-20 DIAGNOSIS — M25552 Pain in left hip: Secondary | ICD-10-CM | POA: Diagnosis not present

## 2016-06-20 DIAGNOSIS — M9911 Subluxation complex (vertebral) of cervical region: Secondary | ICD-10-CM | POA: Diagnosis not present

## 2016-06-20 DIAGNOSIS — M25551 Pain in right hip: Secondary | ICD-10-CM | POA: Diagnosis not present

## 2016-06-20 DIAGNOSIS — M542 Cervicalgia: Secondary | ICD-10-CM | POA: Diagnosis not present

## 2016-06-25 DIAGNOSIS — E890 Postprocedural hypothyroidism: Secondary | ICD-10-CM | POA: Diagnosis not present

## 2016-06-25 DIAGNOSIS — L209 Atopic dermatitis, unspecified: Secondary | ICD-10-CM | POA: Diagnosis not present

## 2016-06-25 DIAGNOSIS — E109 Type 1 diabetes mellitus without complications: Secondary | ICD-10-CM | POA: Diagnosis not present

## 2016-06-25 DIAGNOSIS — E559 Vitamin D deficiency, unspecified: Secondary | ICD-10-CM | POA: Diagnosis not present

## 2016-06-25 DIAGNOSIS — E10649 Type 1 diabetes mellitus with hypoglycemia without coma: Secondary | ICD-10-CM | POA: Insufficient documentation

## 2016-06-25 DIAGNOSIS — E7800 Pure hypercholesterolemia, unspecified: Secondary | ICD-10-CM | POA: Diagnosis not present

## 2016-07-16 DIAGNOSIS — H2513 Age-related nuclear cataract, bilateral: Secondary | ICD-10-CM | POA: Diagnosis not present

## 2016-07-16 DIAGNOSIS — E0500 Thyrotoxicosis with diffuse goiter without thyrotoxic crisis or storm: Secondary | ICD-10-CM | POA: Diagnosis not present

## 2016-07-16 DIAGNOSIS — E0501 Thyrotoxicosis with diffuse goiter with thyrotoxic crisis or storm: Secondary | ICD-10-CM | POA: Diagnosis not present

## 2016-07-16 DIAGNOSIS — E109 Type 1 diabetes mellitus without complications: Secondary | ICD-10-CM | POA: Diagnosis not present

## 2016-07-16 DIAGNOSIS — H524 Presbyopia: Secondary | ICD-10-CM | POA: Diagnosis not present

## 2016-07-23 DIAGNOSIS — M542 Cervicalgia: Secondary | ICD-10-CM | POA: Diagnosis not present

## 2016-07-23 DIAGNOSIS — M9903 Segmental and somatic dysfunction of lumbar region: Secondary | ICD-10-CM | POA: Diagnosis not present

## 2016-07-23 DIAGNOSIS — M9901 Segmental and somatic dysfunction of cervical region: Secondary | ICD-10-CM | POA: Diagnosis not present

## 2016-07-23 DIAGNOSIS — M25552 Pain in left hip: Secondary | ICD-10-CM | POA: Diagnosis not present

## 2016-07-23 DIAGNOSIS — M25551 Pain in right hip: Secondary | ICD-10-CM | POA: Diagnosis not present

## 2016-07-23 DIAGNOSIS — M62830 Muscle spasm of back: Secondary | ICD-10-CM | POA: Diagnosis not present

## 2016-07-25 DIAGNOSIS — E559 Vitamin D deficiency, unspecified: Secondary | ICD-10-CM | POA: Diagnosis not present

## 2016-07-25 DIAGNOSIS — E10649 Type 1 diabetes mellitus with hypoglycemia without coma: Secondary | ICD-10-CM | POA: Diagnosis not present

## 2016-07-25 DIAGNOSIS — E890 Postprocedural hypothyroidism: Secondary | ICD-10-CM | POA: Diagnosis not present

## 2016-07-25 DIAGNOSIS — E109 Type 1 diabetes mellitus without complications: Secondary | ICD-10-CM | POA: Diagnosis not present

## 2016-08-06 DIAGNOSIS — E890 Postprocedural hypothyroidism: Secondary | ICD-10-CM | POA: Diagnosis not present

## 2016-08-06 DIAGNOSIS — E10649 Type 1 diabetes mellitus with hypoglycemia without coma: Secondary | ICD-10-CM | POA: Diagnosis not present

## 2016-08-06 DIAGNOSIS — E109 Type 1 diabetes mellitus without complications: Secondary | ICD-10-CM | POA: Diagnosis not present

## 2016-08-06 DIAGNOSIS — Z87440 Personal history of urinary (tract) infections: Secondary | ICD-10-CM | POA: Diagnosis not present

## 2016-08-06 DIAGNOSIS — E559 Vitamin D deficiency, unspecified: Secondary | ICD-10-CM | POA: Diagnosis not present

## 2016-08-15 DIAGNOSIS — R0789 Other chest pain: Secondary | ICD-10-CM | POA: Diagnosis not present

## 2016-08-15 DIAGNOSIS — R159 Full incontinence of feces: Secondary | ICD-10-CM | POA: Diagnosis not present

## 2016-08-15 DIAGNOSIS — R152 Fecal urgency: Secondary | ICD-10-CM | POA: Diagnosis not present

## 2016-08-15 DIAGNOSIS — K529 Noninfective gastroenteritis and colitis, unspecified: Secondary | ICD-10-CM | POA: Diagnosis not present

## 2016-08-15 DIAGNOSIS — E10649 Type 1 diabetes mellitus with hypoglycemia without coma: Secondary | ICD-10-CM | POA: Diagnosis not present

## 2016-08-15 DIAGNOSIS — M25551 Pain in right hip: Secondary | ICD-10-CM | POA: Diagnosis not present

## 2016-08-15 DIAGNOSIS — M25552 Pain in left hip: Secondary | ICD-10-CM | POA: Diagnosis not present

## 2016-08-20 DIAGNOSIS — E109 Type 1 diabetes mellitus without complications: Secondary | ICD-10-CM | POA: Diagnosis not present

## 2016-08-20 DIAGNOSIS — E10649 Type 1 diabetes mellitus with hypoglycemia without coma: Secondary | ICD-10-CM | POA: Diagnosis not present

## 2016-08-22 DIAGNOSIS — M25552 Pain in left hip: Secondary | ICD-10-CM | POA: Diagnosis not present

## 2016-08-22 DIAGNOSIS — M9901 Segmental and somatic dysfunction of cervical region: Secondary | ICD-10-CM | POA: Diagnosis not present

## 2016-08-22 DIAGNOSIS — M9903 Segmental and somatic dysfunction of lumbar region: Secondary | ICD-10-CM | POA: Diagnosis not present

## 2016-08-22 DIAGNOSIS — M62830 Muscle spasm of back: Secondary | ICD-10-CM | POA: Diagnosis not present

## 2016-08-22 DIAGNOSIS — M25551 Pain in right hip: Secondary | ICD-10-CM | POA: Diagnosis not present

## 2016-08-22 DIAGNOSIS — M542 Cervicalgia: Secondary | ICD-10-CM | POA: Diagnosis not present

## 2016-08-30 DIAGNOSIS — R002 Palpitations: Secondary | ICD-10-CM | POA: Diagnosis not present

## 2016-08-30 DIAGNOSIS — L98499 Non-pressure chronic ulcer of skin of other sites with unspecified severity: Secondary | ICD-10-CM | POA: Diagnosis not present

## 2016-08-30 DIAGNOSIS — E10622 Type 1 diabetes mellitus with other skin ulcer: Secondary | ICD-10-CM | POA: Diagnosis not present

## 2016-08-30 DIAGNOSIS — R0789 Other chest pain: Secondary | ICD-10-CM | POA: Diagnosis not present

## 2016-08-30 DIAGNOSIS — E890 Postprocedural hypothyroidism: Secondary | ICD-10-CM | POA: Diagnosis not present

## 2016-09-10 DIAGNOSIS — E890 Postprocedural hypothyroidism: Secondary | ICD-10-CM | POA: Diagnosis not present

## 2016-09-10 DIAGNOSIS — G2581 Restless legs syndrome: Secondary | ICD-10-CM | POA: Insufficient documentation

## 2016-09-10 DIAGNOSIS — E109 Type 1 diabetes mellitus without complications: Secondary | ICD-10-CM | POA: Diagnosis not present

## 2016-09-10 DIAGNOSIS — E559 Vitamin D deficiency, unspecified: Secondary | ICD-10-CM | POA: Diagnosis not present

## 2016-09-19 DIAGNOSIS — M9901 Segmental and somatic dysfunction of cervical region: Secondary | ICD-10-CM | POA: Diagnosis not present

## 2016-09-19 DIAGNOSIS — M542 Cervicalgia: Secondary | ICD-10-CM | POA: Diagnosis not present

## 2016-09-19 DIAGNOSIS — M62830 Muscle spasm of back: Secondary | ICD-10-CM | POA: Diagnosis not present

## 2016-09-19 DIAGNOSIS — M25552 Pain in left hip: Secondary | ICD-10-CM | POA: Diagnosis not present

## 2016-09-19 DIAGNOSIS — M9903 Segmental and somatic dysfunction of lumbar region: Secondary | ICD-10-CM | POA: Diagnosis not present

## 2016-09-19 DIAGNOSIS — M25551 Pain in right hip: Secondary | ICD-10-CM | POA: Diagnosis not present

## 2016-09-23 DIAGNOSIS — R748 Abnormal levels of other serum enzymes: Secondary | ICD-10-CM | POA: Diagnosis not present

## 2016-09-25 DIAGNOSIS — R0789 Other chest pain: Secondary | ICD-10-CM | POA: Diagnosis not present

## 2016-09-25 DIAGNOSIS — R002 Palpitations: Secondary | ICD-10-CM | POA: Diagnosis not present

## 2016-10-04 DIAGNOSIS — K7689 Other specified diseases of liver: Secondary | ICD-10-CM | POA: Diagnosis not present

## 2016-10-04 DIAGNOSIS — R109 Unspecified abdominal pain: Secondary | ICD-10-CM | POA: Diagnosis not present

## 2016-10-04 DIAGNOSIS — K760 Fatty (change of) liver, not elsewhere classified: Secondary | ICD-10-CM | POA: Diagnosis not present

## 2016-10-10 DIAGNOSIS — R748 Abnormal levels of other serum enzymes: Secondary | ICD-10-CM | POA: Diagnosis not present

## 2016-10-15 DIAGNOSIS — I200 Unstable angina: Secondary | ICD-10-CM | POA: Diagnosis not present

## 2016-10-16 DIAGNOSIS — L98499 Non-pressure chronic ulcer of skin of other sites with unspecified severity: Secondary | ICD-10-CM | POA: Diagnosis not present

## 2016-10-16 DIAGNOSIS — E10622 Type 1 diabetes mellitus with other skin ulcer: Secondary | ICD-10-CM | POA: Diagnosis not present

## 2016-10-16 DIAGNOSIS — R079 Chest pain, unspecified: Secondary | ICD-10-CM | POA: Diagnosis not present

## 2016-10-16 DIAGNOSIS — R002 Palpitations: Secondary | ICD-10-CM | POA: Diagnosis not present

## 2016-10-31 DIAGNOSIS — M542 Cervicalgia: Secondary | ICD-10-CM | POA: Diagnosis not present

## 2016-10-31 DIAGNOSIS — M9903 Segmental and somatic dysfunction of lumbar region: Secondary | ICD-10-CM | POA: Diagnosis not present

## 2016-10-31 DIAGNOSIS — M62830 Muscle spasm of back: Secondary | ICD-10-CM | POA: Diagnosis not present

## 2016-10-31 DIAGNOSIS — M25551 Pain in right hip: Secondary | ICD-10-CM | POA: Diagnosis not present

## 2016-10-31 DIAGNOSIS — M25552 Pain in left hip: Secondary | ICD-10-CM | POA: Diagnosis not present

## 2016-10-31 DIAGNOSIS — M9901 Segmental and somatic dysfunction of cervical region: Secondary | ICD-10-CM | POA: Diagnosis not present

## 2016-12-12 DIAGNOSIS — E109 Type 1 diabetes mellitus without complications: Secondary | ICD-10-CM | POA: Diagnosis not present

## 2016-12-17 DIAGNOSIS — K6289 Other specified diseases of anus and rectum: Secondary | ICD-10-CM | POA: Diagnosis not present

## 2016-12-17 DIAGNOSIS — K529 Noninfective gastroenteritis and colitis, unspecified: Secondary | ICD-10-CM | POA: Diagnosis not present

## 2017-01-06 ENCOUNTER — Emergency Department (HOSPITAL_BASED_OUTPATIENT_CLINIC_OR_DEPARTMENT_OTHER)
Admission: EM | Admit: 2017-01-06 | Discharge: 2017-01-07 | Disposition: A | Discharge status: Home / Self Care | Payer: PPO | Attending: Emergency Medicine | Admitting: Emergency Medicine

## 2017-01-06 ENCOUNTER — Encounter (HOSPITAL_BASED_OUTPATIENT_CLINIC_OR_DEPARTMENT_OTHER): Payer: Self-pay | Admitting: *Deleted

## 2017-01-06 DIAGNOSIS — Z794 Long term (current) use of insulin: Secondary | ICD-10-CM | POA: Diagnosis not present

## 2017-01-06 DIAGNOSIS — E119 Type 2 diabetes mellitus without complications: Secondary | ICD-10-CM | POA: Insufficient documentation

## 2017-01-06 DIAGNOSIS — R079 Chest pain, unspecified: Secondary | ICD-10-CM | POA: Diagnosis present

## 2017-01-06 DIAGNOSIS — Z79899 Other long term (current) drug therapy: Secondary | ICD-10-CM | POA: Diagnosis not present

## 2017-01-06 DIAGNOSIS — R0789 Other chest pain: Secondary | ICD-10-CM | POA: Insufficient documentation

## 2017-01-06 LAB — CBC WITH DIFFERENTIAL/PLATELET
BASOS ABS: 0.1 10*3/uL (ref 0.0–0.1)
Basophils Relative: 1 %
Eosinophils Absolute: 0.2 10*3/uL (ref 0.0–0.7)
Eosinophils Relative: 3 %
HEMATOCRIT: 35.9 % — AB (ref 36.0–46.0)
HEMOGLOBIN: 11.9 g/dL — AB (ref 12.0–15.0)
LYMPHS PCT: 32 %
Lymphs Abs: 1.9 10*3/uL (ref 0.7–4.0)
MCH: 32.2 pg (ref 26.0–34.0)
MCHC: 33.1 g/dL (ref 30.0–36.0)
MCV: 97 fL (ref 78.0–100.0)
MONO ABS: 0.5 10*3/uL (ref 0.1–1.0)
MONOS PCT: 9 %
NEUTROS ABS: 3.3 10*3/uL (ref 1.7–7.7)
NEUTROS PCT: 55 %
Platelets: 286 10*3/uL (ref 150–400)
RBC: 3.7 MIL/uL — ABNORMAL LOW (ref 3.87–5.11)
RDW: 12 % (ref 11.5–15.5)
WBC: 6 10*3/uL (ref 4.0–10.5)

## 2017-01-06 LAB — CBG MONITORING, ED: Glucose-Capillary: 174 mg/dL — ABNORMAL HIGH (ref 65–99)

## 2017-01-06 NOTE — ED Triage Notes (Signed)
Chest pain for a year since getting IV fluids. She has had several evaluations and no one can find out why she is having the pain.

## 2017-01-06 NOTE — ED Provider Notes (Signed)
[facility] DEPT [facility] Provider Note: Georgena Spurling, MD, FACEP  CSN: [id number] MRN: [id number] ARRIVAL: 01/06/17 at [place]  Chest Pain   HISTORY OF PRESENT ILLNESS  01/06/17 11:16 PM Jody Cooke is a 56 y.o. female who was treated in the emergency department for diarrhea and dehydration back last October. She states she received about 6 liters of IV fluids. As a result of this she subsequently developed "restless leg syndrome" which she characterizes as her legs twitching and sometimes fasciculated, primarily at night. These symptoms have gradually worsened over the past year and also involves her chest. She states she has "anxiety attacks" in her chest which she describes as sudden, sharp pains that last about a second. These happen over and over again may be up to 10 times a day. They are worse at night. She has had some relief with tonic water, mustard and Tylenol. There is no associated shortness of breath, diaphoresis, nausea or vomiting. The sharp pains are not severe but cause her to be frustrated. She has also had polydipsia but is a known diabetic on insulin. Her sugar on arrival and 74.   Past Medical History:  Diagnosis Date  . Chronic diarrhea   . Diabetes mellitus   . Grave's disease     Past Surgical History:  Procedure Laterality Date  . ABDOMINAL HYSTERECTOMY    . eye lid surgery      No family history on file.  Social History  Substance Use Topics  . Smoking status: Never Smoker  . Smokeless tobacco: Never Used  . Alcohol use No    Prior to Admission medications   Medication Sig Start Date End Date Taking? Authorizing Provider  ondansetron (ZOFRAN ODT) 4 MG disintegrating tablet Take 1 tablet (4 mg total) by mouth every 8 (eight) hours as needed for nausea or vomiting. 03/03/16  Yes Long, Wonda Olds, MD  B Complex-C (SUPER B COMPLEX PO) Take 6-8 tablets by mouth daily.     [provider]  cetirizine (ZYRTEC) 10 MG  tablet Take 10 mg by mouth daily.     [provider]  Cholecalciferol (VITAMIN D) 2000 UNITS tablet Take 2,000 Units by mouth daily.      [provider]  diphenoxylate-atropine (LOMOTIL) 2.5-0.025 MG per tablet Take 1 tablet by mouth 4 (four) times daily as needed. For diarrhea     [provider]  Ibuprofen (MIDOL) 200 MG CAPS Take 1 capsule by mouth every 6 (six) hours as needed. For cramps    [provider]  insulin aspart (NOVOLOG) 100 UNIT/ML injection Inject 4.5-10 Units into the skin 3 (three) times daily before meals. Patient is on a sliding scale and she states that the range changes daily.    [provider]  insulin detemir (LEVEMIR) 100 UNIT/ML injection Inject 19.5 Units into the skin daily.     [provider]  levothyroxine (SYNTHROID, LEVOTHROID) 125 MCG tablet Take 125 mcg by mouth daily before breakfast.    [provider]  levothyroxine (SYNTHROID, LEVOTHROID) 175 MCG tablet Take 175 mcg by mouth daily.      [provider]  mometasone (ELOCON) 0.1 % cream Apply 1 application topically daily.      [provider]  Multiple Vitamins-Minerals (MULTIVITAMINS THER. W/MINERALS) TABS Take 2 tablets by mouth daily.     [provider]  promethazine (PHENERGAN) 25 MG suppository Place 1 suppository (25 mg total) rectally every 6 (six)  hours as needed for nausea or vomiting. 03/03/16   Long, Wonda Olds, MD  simvastatin (ZOCOR) 20 MG tablet Take 20 mg by mouth daily.     [provider]    Allergies Aspirin and Penicillins   REVIEW OF SYSTEMS  Negative except as noted here or in the History of Present Illness.   PHYSICAL EXAMINATION  Initial Vital Signs Blood pressure (!) 145/68, pulse 85, temperature 98.4 F (36.9 C), temperature source Oral, resp. rate 18, height 5\' 9"  (1.753 m), weight 83 kg (183 lb), last menstrual period 12/10/2011, SpO2 98 %.  Examination General:  Well-developed, well-nourished female in no acute distress; appearance consistent with age of record HENT: normocephalic; atraumatic Eyes: pupils equal, round and reactive to light; extraocular muscles intact Neck: supple Heart: regular rate and rhythm; no murmur; no ectopy Lungs: clear to auscultation bilaterally Abdomen: soft; nondistended; nontender; bowel sounds present Extremities: No deformity; full range of motion; pulses normal Neurologic: Awake, alert and oriented; motor function intact in all extremities and symmetric; no facial droop Skin: Warm and dry Psychiatric: Normal mood and affect   RESULTS  Summary of this visit's results, reviewed by myself:   EKG Interpretation  Date/Time:  Monday January 06 2017 19:12:52 EDT Ventricular Rate:  84 PR Interval:  124 QRS Duration: 82 QT Interval:  354 QTC Calculation: 418 R Axis:   85 Text Interpretation:  Normal sinus rhythm Right atrial enlargement Borderline ECG No significant change was found Confirmed by Landra Howze [phone]) on 01/06/2017 11:16:30 PM      Laboratory Studies: Results for orders placed or performed during the hospital encounter of 01/06/17 (from the past 24 hour(s))  POC CBG, ED     Status: Abnormal   Collection Time: 01/06/17  7:11 PM  Result Value Ref Range   Glucose-Capillary 174 (H) 65 - 99 mg/dL  Troponin I     Status: None   Collection Time: 01/06/17 11:34 PM  Result Value Ref Range   Troponin I <0.03 <0.03 ng/mL  CBC with Differential/Platelet     Status: Abnormal   Collection Time: 01/06/17 11:46 PM  Result Value Ref Range   WBC 6.0 4.0 - 10.5 K/uL   RBC 3.70 (L) 3.87 - 5.11 MIL/uL   Hemoglobin 11.9 (L) 12.0 - 15.0 g/dL   HCT 35.9 (L) 36.0 - 46.0 %   MCV 97.0 78.0 - 100.0 fL   MCH 32.2 26.0 - 34.0 pg   MCHC 33.1 30.0 - 36.0 g/dL   RDW 12.0 11.5 - 15.5 %   Platelets 286 150 - 400 K/uL   Neutrophils Relative % 55 %   Neutro Abs 3.3 1.7 - 7.7 K/uL   Lymphocytes Relative 32 %   Lymphs Abs  1.9 0.7 - 4.0 K/uL   Monocytes Relative 9 %   Monocytes Absolute 0.5 0.1 - 1.0 K/uL   Eosinophils Relative 3 %   Eosinophils Absolute 0.2 0.0 - 0.7 K/uL   Basophils Relative 1 %   Basophils Absolute 0.1 0.0 - 0.1 K/uL  Basic metabolic panel     Status: Abnormal   Collection Time: 01/06/17 11:46 PM  Result Value Ref Range   Sodium 138 135 - 145 mmol/L   Potassium 3.7 3.5 - 5.1 mmol/L   Chloride 103 101 - 111 mmol/L   CO2 25 22 - 32 mmol/L   Glucose, Bld 135 (H) 65 - 99 mg/dL   BUN 16 6 - 20 mg/dL   Creatinine, Ser 0.85 0.44 - 1.00 mg/dL  Calcium 9.6 8.9 - 10.3 mg/dL   GFR calc non Af Amer >60 >60 mL/min   GFR calc Af Amer >60 >60 mL/min   Anion gap 10 5 - 15   Imaging Studies: No results found.  ED COURSE  Nursing notes and initial vitals signs, including pulse oximetry, reviewed.  Vitals:   01/06/17 1906 01/06/17 1908 01/06/17 2216  BP:  (!) 152/60 (!) 145/68  Pulse:  87 85  Resp:  20 18  Temp:  98.4 F (36.9 C)   TempSrc:  Oral   SpO2:  98% 98%  Weight: 83 kg (183 lb)    Height: 5\' 9"  (1.753 m)     12:26 AM Patient monitored. No ectopy seen on monitor strip. Patient is afraid that she will go home and not be able to sleep due to her chest restless leg syndrome. She was advised that no ectopy or arrhythmia was seen on the monitor. We will give her some Ativan and have her follow-up with her primary care physician. We will also refer to cardiology for possible Holter monitoring.  PROCEDURES    ED DIAGNOSES     ICD-10-CM   1. Atypical chest pain R07.89        Shanon Rosser, MD 01/07/17 0040

## 2017-01-06 NOTE — ED Notes (Addendum)
Chest pain only when she sleeps since October 2017.  She sees Dr. Otho Perl, cardiologist at [facility].  Denies chest pain or any discomfort at this time.  Patient stated that she has a restless leg that is undiagnosed.  Pt self diagnosed herself stating that her leg jerks in the morning.  She takes med/supplement from [employer] such as Vitamin A and Niacin

## 2017-01-07 LAB — BASIC METABOLIC PANEL
Anion gap: 10 (ref 5–15)
BUN: 16 mg/dL (ref 6–20)
CHLORIDE: 103 mmol/L (ref 101–111)
CO2: 25 mmol/L (ref 22–32)
CREATININE: 0.85 mg/dL (ref 0.44–1.00)
Calcium: 9.6 mg/dL (ref 8.9–10.3)
GFR calc Af Amer: >60 mL/min (ref >60)
GFR calc non Af Amer: >60 mL/min (ref >60)
GLUCOSE: 135 mg/dL — AB (ref 65–99)
Potassium: 3.7 mmol/L (ref 3.5–5.1)
SODIUM: 138 mmol/L (ref 135–145)

## 2017-01-07 LAB — TROPONIN I: Troponin I: <0.03 ng/mL (ref <0.03)

## 2017-01-07 MED ORDER — LORAZEPAM 1 MG PO TABS: 1.0000 mg | ORAL_TABLET | Freq: Every evening | ORAL | 0 refills | Status: DC | PRN

## 2017-01-07 MED ORDER — LORAZEPAM 1 MG PO TABS
1.0000 mg | ORAL_TABLET | Freq: Once | ORAL | Status: AC
  Administered 2017-01-07: 1 mg via ORAL
  Filled 2017-01-07: qty 1

## 2017-01-07 NOTE — ED Notes (Signed)
Pt verbalizes understanding of d/c instructions and denies any further needs at this time. 

## 2017-01-07 NOTE — ED Notes (Signed)
ED Provider at bedside. 

## 2017-01-10 NOTE — Progress Notes (Signed)
Cardiology Office Note:    Date:  01/14/2017   ID:  Jody Cooke, DOB Nov 20, 1960, MRN [id number]  PCP:  Robyne Peers, MD  Cardiologist:  Shirlee More, MD   Referring MD: Robyne Peers, MD  ASSESSMENT:    1. Chest pain in adult    PLAN:    In order of problems listed above:  1. Very atypical symptoms but in a high risk patient with type 1 diabetes For more than 50 years. I asked her to undergo an arrhythmia evaluation utilizing a Holter monitor for 48 hoursas well as of further ischemia evaluation with cardiac CTA and flow reserve. If normal I would perform no further diagnostic cardiac testing. I reviewed her previous cardiology evaluation echocardiogram stress echo and emergency room visits with labs and EKG prior to her office visit today.note she no longer is on a statin.  Next appointment in 4 weeks   Medication Adjustments/Labs and Tests Ordered: Current medicines are reviewed at length with the patient today.  Concerns regarding medicines are outlined above.  Orders Placed This Encounter  Procedures  . CT CORONARY MORPH W/CTA COR W/SCORE W/CA W/CM &/OR WO/CM  . CT CORONARY FRACTIONAL FLOW RESERVE DATA PREP  . CT CORONARY FRACTIONAL FLOW RESERVE FLUID ANALYSIS  . Holter monitor - 48 hour   No orders of the defined types were placed in this encounter.    Chief Complaint  Patient presents with  . New Patient (Initial Visit)    per Dr Florina Ou to evaluate CP  . Chest Pain    x 1 year     History of Present Illness:    Jody Cooke is a 56 y.o. female with DM on insulin , hyperlipidemia and hypothyroidism who is being seen today for the evaluation of chest pain after an  ED visit 01/06/17 at the request of Robyne Peers, MD. She was seen by Dr Otho Perl at [facility] 10/16/16 and had an echocardiogram which showed normal left systolic function and no significant abnormalities. She also had a stress echo which she completed yesterday. This is remarkable for demonstrating no  evidence of ischemia on EKG or echo images.  For the last year she has had an unusual clinical syndrome of anxiety restless legs and chest ache at nighttime preventing sleep. The symptoms are nonexertional unrelieved with rest and are fleeting lasting momentarily and recurring through the night. No associated shortness of breath no radiation not pleuritic no associated GI symptoms. She is a long-standing type I diabetic with neuropathy. The episodes aren't associated with hypoglycemia. During the day she fatigues easily but does not have exertional chest pain.she both denies palpitation as well as saying from time to time she wonders if her heart is racing during these episodes. The episodes now occurring nightly.She has no history of congenital or rheumatic heart disease. She's had no syncope or TIA.     Past Medical History:  Diagnosis Date  . Chronic diarrhea   . Diabetes mellitus   . Grave's disease     Past Surgical History:  Procedure Laterality Date  . ABDOMINAL HYSTERECTOMY    . eye lid surgery      Current Medications: Current Meds  Medication Sig  . cetirizine (ZYRTEC) 10 MG tablet Take 10 mg by mouth daily.   . diphenoxylate-atropine (LOMOTIL) 2.5-0.025 MG per tablet Take 1 tablet by mouth 4 (four) times daily as needed. For diarrhea   . FISH OIL-VITAMIN D PO Take 1 tablet by mouth daily.  Marland Kitchen  insulin aspart (NOVOLOG) 100 UNIT/ML injection Inject 4.5-10 Units into the skin 3 (three) times daily before meals. Patient is on a sliding scale and she states that the range changes daily.  . insulin detemir (LEVEMIR) 100 UNIT/ML injection Inject 19.5 Units into the skin daily.   Marland Kitchen levothyroxine (SYNTHROID, LEVOTHROID) 112 MCG tablet Take 0.112 mcg by mouth daily.  . mometasone (ELOCON) 0.1 % cream Apply 1 application topically daily.    Marland Kitchen NIACIN PO Take 1 tablet by mouth daily. 30 to 40 mg daily/Pink Himalayan Salt  . triamcinolone (NASACORT) 55 MCG/ACT AERO nasal inhaler Place 1  spray into the nose daily.  Marland Kitchen trimethoprim (TRIMPEX) 100 MG tablet Take 100 mg by mouth daily as needed.     Allergies:   Aspirin; Milk-related compounds; Other; Penicillins; Sulfa antibiotics; and Wheat extract   Social History   Social History  . Marital status: Married    Spouse name: N/A  . Number of children: N/A  . Years of education: N/A   Social History Main Topics  . Smoking status: Former Smoker    Packs/day: 1.00    Quit date: 41  . Smokeless tobacco: Never Used  . Alcohol use No  . Drug use: No  . Sexual activity: Not Asked   Other Topics Concern  . None   Social History Narrative  . None     Family History: The patient's family history includes Alzheimer's disease in her mother; Diabetes in her mother; Hernia in her father; Hypertension in her mother; Scoliosis in her mother.  ROS:   ROS Please see the history of present illness.     All other systems reviewed and are negative.  EKGs/Labs/Other Studies Reviewed:    The following studies were reviewed today: She has chronic diarrhea and restless leg EKG Interpretation  Date/Time:                  Monday January 06 2017 19:12:52 EDT Ventricular Rate:   84 PR Interval:                      124 QRS Duration:        82 QT Interval:                      354 QTC Calculation:    418 R Axis:                         85 Text Interpretation:  Normal sinus rhythm Right atrial enlargement Borderline ECG No significant change was found Confirmed by Molpus, John [phone]) on 01/06/2017 11:16:30 PM   CXR: CHEST - 2 VIEW Comparison: 03/25/2008 Findings: Artifact overlies chest.  Heart size is normal. Mediastinal shadows are normal.  The lungs are clear.  No effusions.  No significant bony finding. IMPRESSION: Normal chest  Original Report Authenticated By: Jules Schick, M.D. Recent Labs: Troponin <0.01 03/03/2016: ALT 14 01/06/2017: BUN 16; Creatinine, Ser 0.85; Hemoglobin 11.9; Platelets 286; Potassium  3.7; Sodium 138  Recent Lipid Panel No results found for: CHOL, TRIG, HDL, CHOLHDL, VLDL, LDLCALC, LDLDIRECT  Physical Exam:    VS:  BP 140/78 (BP Location: Left Arm, Patient Position: Sitting)   Pulse 81   Ht 5\' 9"  (1.753 m)   Wt 180 lb 6.4 oz (81.8 kg)   LMP 12/10/2011   SpO2 98%   BMI 26.64 kg/m     Wt Readings from Last 3  Encounters:  01/14/17 180 lb 6.4 oz (81.8 kg)  01/06/17 183 lb (83 kg)  03/03/16 185 lb (83.9 kg)     GEN:  Well nourished, well developed in no acute distress HEENT: Normal NECK: No JVD; No carotid bruits LYMPHATICS: No lymphadenopathy CARDIAC: RRR, no murmurs, rubs, gallops RESPIRATORY:  Clear to auscultation without rales, wheezing or rhonchi  ABDOMEN: Soft, non-tender, non-distended MUSCULOSKELETAL:  No edema; No deformity  SKIN: Warm and dry NEUROLOGIC:  Alert and oriented x 3 PSYCHIATRIC:  Normal affect     Signed, Shirlee More, MD  01/14/2017 4:09 PM    [facility]

## 2017-01-14 ENCOUNTER — Encounter: Payer: Self-pay | Admitting: Cardiology

## 2017-01-14 ENCOUNTER — Ambulatory Visit (INDEPENDENT_AMBULATORY_CARE_PROVIDER_SITE_OTHER): Payer: PPO | Admitting: Cardiology

## 2017-01-14 DIAGNOSIS — R079 Chest pain, unspecified: Secondary | ICD-10-CM

## 2017-01-14 NOTE — Patient Instructions (Signed)
Medication Instructions:  Your physician recommends that you continue on your current medications as directed. Please refer to the Current Medication list given to you today.  Labwork: None  Testing/Procedures: Your physician has requested that you have cardiac CT. Cardiac computed tomography (CT) is a painless test that uses an x-ray machine to take clear, detailed pictures of your heart. For further information please visit [email or web address][email or web address]. Please follow instruction sheet as given.  Your physician has recommended that you wear a holter monitor. Holter monitors are medical devices that record the heart's electrical activity. Doctors most often use these monitors to diagnose arrhythmias. Arrhythmias are problems with the speed or rhythm of the heartbeat. The monitor is a small, portable device. You can wear one while you do your normal daily activities. This is usually used to diagnose what is causing palpitations/syncope (passing out).   Follow-Up: Your physician recommends that you schedule a follow-up appointment in: 3 weeks   Any Other Special Instructions Will Be Listed Below (If Applicable).     If you need a refill on your cardiac medications before your next appointment, please call your pharmacy.

## 2017-01-16 DIAGNOSIS — Z780 Asymptomatic menopausal state: Secondary | ICD-10-CM | POA: Diagnosis not present

## 2017-01-16 DIAGNOSIS — Z1231 Encounter for screening mammogram for malignant neoplasm of breast: Secondary | ICD-10-CM | POA: Diagnosis not present

## 2017-01-16 DIAGNOSIS — M810 Age-related osteoporosis without current pathological fracture: Secondary | ICD-10-CM | POA: Diagnosis not present

## 2017-01-16 DIAGNOSIS — M85852 Other specified disorders of bone density and structure, left thigh: Secondary | ICD-10-CM | POA: Diagnosis not present

## 2017-01-20 ENCOUNTER — Telehealth: Payer: Self-pay

## 2017-01-20 ENCOUNTER — Ambulatory Visit: Payer: PPO

## 2017-01-20 DIAGNOSIS — R0789 Other chest pain: Secondary | ICD-10-CM | POA: Diagnosis not present

## 2017-01-20 DIAGNOSIS — R079 Chest pain, unspecified: Secondary | ICD-10-CM

## 2017-01-20 NOTE — Telephone Encounter (Signed)
Patient returing your call about results.cn

## 2017-01-20 NOTE — Telephone Encounter (Signed)
Patient advised that cardiac CT was sent to the scheduling team and they are working on pre-cert from insurance. Resent a message to the scheduling team. Advised patient to return call if still has not heard anything by the end of this week.

## 2017-01-21 DIAGNOSIS — K580 Irritable bowel syndrome with diarrhea: Secondary | ICD-10-CM | POA: Diagnosis not present

## 2017-01-21 DIAGNOSIS — R159 Full incontinence of feces: Secondary | ICD-10-CM | POA: Diagnosis not present

## 2017-01-21 DIAGNOSIS — R152 Fecal urgency: Secondary | ICD-10-CM | POA: Diagnosis not present

## 2017-01-23 ENCOUNTER — Telehealth: Payer: Self-pay | Admitting: Cardiology

## 2017-01-23 NOTE — Telephone Encounter (Signed)
error 

## 2017-01-24 DIAGNOSIS — F419 Anxiety disorder, unspecified: Secondary | ICD-10-CM | POA: Diagnosis not present

## 2017-01-29 DIAGNOSIS — R0789 Other chest pain: Secondary | ICD-10-CM | POA: Diagnosis not present

## 2017-01-29 DIAGNOSIS — Z23 Encounter for immunization: Secondary | ICD-10-CM | POA: Diagnosis not present

## 2017-02-06 ENCOUNTER — Telehealth: Payer: Self-pay

## 2017-02-06 ENCOUNTER — Encounter: Payer: Self-pay | Admitting: Cardiology

## 2017-02-06 NOTE — Telephone Encounter (Signed)
Patient advised to take 1/2 the dose of Levemir the night before the procedure and do not take Novolog until she eats breakfast after the test. Patient verbalized understanding.

## 2017-02-07 DIAGNOSIS — M85852 Other specified disorders of bone density and structure, left thigh: Secondary | ICD-10-CM | POA: Diagnosis not present

## 2017-02-11 ENCOUNTER — Ambulatory Visit: Payer: PPO | Admitting: Cardiology

## 2017-02-11 DIAGNOSIS — R634 Abnormal weight loss: Secondary | ICD-10-CM | POA: Diagnosis not present

## 2017-02-11 DIAGNOSIS — Z794 Long term (current) use of insulin: Secondary | ICD-10-CM | POA: Diagnosis not present

## 2017-02-11 DIAGNOSIS — R631 Polydipsia: Secondary | ICD-10-CM | POA: Diagnosis not present

## 2017-02-11 DIAGNOSIS — E1042 Type 1 diabetes mellitus with diabetic polyneuropathy: Secondary | ICD-10-CM | POA: Diagnosis not present

## 2017-02-11 DIAGNOSIS — E890 Postprocedural hypothyroidism: Secondary | ICD-10-CM | POA: Diagnosis not present

## 2017-02-11 DIAGNOSIS — R197 Diarrhea, unspecified: Secondary | ICD-10-CM | POA: Diagnosis not present

## 2017-02-11 DIAGNOSIS — R079 Chest pain, unspecified: Secondary | ICD-10-CM | POA: Diagnosis not present

## 2017-02-11 DIAGNOSIS — E0500 Thyrotoxicosis with diffuse goiter without thyrotoxic crisis or storm: Secondary | ICD-10-CM | POA: Diagnosis not present

## 2017-02-11 DIAGNOSIS — H2513 Age-related nuclear cataract, bilateral: Secondary | ICD-10-CM | POA: Diagnosis not present

## 2017-02-11 DIAGNOSIS — E0501 Thyrotoxicosis with diffuse goiter with thyrotoxic crisis or storm: Secondary | ICD-10-CM | POA: Diagnosis not present

## 2017-02-11 DIAGNOSIS — H524 Presbyopia: Secondary | ICD-10-CM | POA: Diagnosis not present

## 2017-02-11 DIAGNOSIS — E109 Type 1 diabetes mellitus without complications: Secondary | ICD-10-CM | POA: Diagnosis not present

## 2017-02-21 DIAGNOSIS — M5416 Radiculopathy, lumbar region: Secondary | ICD-10-CM | POA: Diagnosis not present

## 2017-02-21 DIAGNOSIS — M47896 Other spondylosis, lumbar region: Secondary | ICD-10-CM | POA: Diagnosis not present

## 2017-02-21 DIAGNOSIS — M545 Low back pain: Secondary | ICD-10-CM | POA: Diagnosis not present

## 2017-02-21 DIAGNOSIS — G8929 Other chronic pain: Secondary | ICD-10-CM | POA: Diagnosis not present

## 2017-02-21 DIAGNOSIS — M47816 Spondylosis without myelopathy or radiculopathy, lumbar region: Secondary | ICD-10-CM | POA: Diagnosis not present

## 2017-02-23 DIAGNOSIS — E119 Type 2 diabetes mellitus without complications: Secondary | ICD-10-CM | POA: Diagnosis not present

## 2017-02-23 DIAGNOSIS — E039 Hypothyroidism, unspecified: Secondary | ICD-10-CM | POA: Diagnosis not present

## 2017-02-23 DIAGNOSIS — W540XXA Bitten by dog, initial encounter: Secondary | ICD-10-CM | POA: Diagnosis not present

## 2017-02-24 ENCOUNTER — Telehealth: Payer: Self-pay | Admitting: Cardiology

## 2017-02-24 DIAGNOSIS — Z23 Encounter for immunization: Secondary | ICD-10-CM | POA: Diagnosis not present

## 2017-02-24 NOTE — Telephone Encounter (Signed)
Patient states that she has been diagnosed with 2 different problems that may be the cause of her symptoms. 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 She states that an orthopedist gave her this diagnosis and advised her to follow up with cardiology. Patient advised to contact her PCP or an allergist to discuss the treatment plan as cardiology does not treat allergies. Patient verbalized understanding.

## 2017-02-24 NOTE — Telephone Encounter (Signed)
Patient has called back and she was diagnosed with her having allergies to her carpet.  She has now avoided it and needs to speak with an Therapist, sports. Please call her.

## 2017-02-25 ENCOUNTER — Ambulatory Visit: Payer: PPO | Admitting: Cardiology

## 2017-02-25 DIAGNOSIS — R0789 Other chest pain: Secondary | ICD-10-CM | POA: Diagnosis not present

## 2017-02-25 DIAGNOSIS — W540XXS Bitten by dog, sequela: Secondary | ICD-10-CM | POA: Diagnosis not present

## 2017-02-25 DIAGNOSIS — Z9109 Other allergy status, other than to drugs and biological substances: Secondary | ICD-10-CM | POA: Diagnosis not present

## 2017-02-27 ENCOUNTER — Telehealth: Payer: Self-pay | Admitting: Cardiology

## 2017-02-27 NOTE — Telephone Encounter (Signed)
FYI

## 2017-02-27 NOTE — Telephone Encounter (Signed)
Patient wants to cancel Cardiac CT for 03/21/17. Patient spoke with [place] and she believes she has found source of heart issues.Garth Schlatter FYI...Marland KitchenMarland Kitchen

## 2017-03-04 DIAGNOSIS — T7849XA Other allergy, initial encounter: Secondary | ICD-10-CM | POA: Diagnosis not present

## 2017-03-12 DIAGNOSIS — R05 Cough: Secondary | ICD-10-CM | POA: Diagnosis not present

## 2017-03-12 DIAGNOSIS — R062 Wheezing: Secondary | ICD-10-CM | POA: Diagnosis not present

## 2017-03-12 DIAGNOSIS — J3081 Allergic rhinitis due to animal (cat) (dog) hair and dander: Secondary | ICD-10-CM | POA: Diagnosis not present

## 2017-03-12 DIAGNOSIS — J3089 Other allergic rhinitis: Secondary | ICD-10-CM | POA: Diagnosis not present

## 2017-03-12 DIAGNOSIS — J301 Allergic rhinitis due to pollen: Secondary | ICD-10-CM | POA: Diagnosis not present

## 2017-03-13 DIAGNOSIS — E890 Postprocedural hypothyroidism: Secondary | ICD-10-CM | POA: Diagnosis not present

## 2017-03-13 DIAGNOSIS — E109 Type 1 diabetes mellitus without complications: Secondary | ICD-10-CM | POA: Diagnosis not present

## 2017-03-18 ENCOUNTER — Ambulatory Visit (HOSPITAL_COMMUNITY): Payer: PPO

## 2017-03-26 DIAGNOSIS — L821 Other seborrheic keratosis: Secondary | ICD-10-CM | POA: Diagnosis not present

## 2017-03-26 DIAGNOSIS — D485 Neoplasm of uncertain behavior of skin: Secondary | ICD-10-CM | POA: Diagnosis not present

## 2017-03-26 DIAGNOSIS — D2371 Other benign neoplasm of skin of right lower limb, including hip: Secondary | ICD-10-CM | POA: Diagnosis not present

## 2017-04-01 ENCOUNTER — Ambulatory Visit: Payer: PPO | Admitting: Cardiology

## 2017-05-09 DIAGNOSIS — E119 Type 2 diabetes mellitus without complications: Secondary | ICD-10-CM | POA: Diagnosis not present

## 2017-05-28 DIAGNOSIS — L738 Other specified follicular disorders: Secondary | ICD-10-CM | POA: Diagnosis not present

## 2017-05-28 DIAGNOSIS — L720 Epidermal cyst: Secondary | ICD-10-CM | POA: Diagnosis not present

## 2017-05-28 DIAGNOSIS — D239 Other benign neoplasm of skin, unspecified: Secondary | ICD-10-CM | POA: Diagnosis not present

## 2017-06-17 DIAGNOSIS — E109 Type 1 diabetes mellitus without complications: Secondary | ICD-10-CM | POA: Diagnosis not present

## 2017-06-17 DIAGNOSIS — K529 Noninfective gastroenteritis and colitis, unspecified: Secondary | ICD-10-CM | POA: Diagnosis not present

## 2017-06-17 DIAGNOSIS — E890 Postprocedural hypothyroidism: Secondary | ICD-10-CM | POA: Diagnosis not present

## 2017-06-24 DIAGNOSIS — M47816 Spondylosis without myelopathy or radiculopathy, lumbar region: Secondary | ICD-10-CM | POA: Diagnosis not present

## 2017-06-24 DIAGNOSIS — Z77120 Contact with and (suspected) exposure to mold (toxic): Secondary | ICD-10-CM | POA: Diagnosis not present

## 2017-06-24 DIAGNOSIS — R8290 Unspecified abnormal findings in urine: Secondary | ICD-10-CM | POA: Diagnosis not present

## 2017-08-19 DIAGNOSIS — L600 Ingrowing nail: Secondary | ICD-10-CM | POA: Diagnosis not present

## 2017-08-19 DIAGNOSIS — M79674 Pain in right toe(s): Secondary | ICD-10-CM | POA: Diagnosis not present

## 2017-09-02 DIAGNOSIS — L600 Ingrowing nail: Secondary | ICD-10-CM | POA: Diagnosis not present

## 2017-09-09 DIAGNOSIS — H2513 Age-related nuclear cataract, bilateral: Secondary | ICD-10-CM | POA: Diagnosis not present

## 2017-09-09 DIAGNOSIS — E109 Type 1 diabetes mellitus without complications: Secondary | ICD-10-CM | POA: Diagnosis not present

## 2017-09-09 DIAGNOSIS — H524 Presbyopia: Secondary | ICD-10-CM | POA: Diagnosis not present

## 2017-09-09 DIAGNOSIS — H04123 Dry eye syndrome of bilateral lacrimal glands: Secondary | ICD-10-CM | POA: Diagnosis not present

## 2017-09-09 DIAGNOSIS — E0500 Thyrotoxicosis with diffuse goiter without thyrotoxic crisis or storm: Secondary | ICD-10-CM | POA: Diagnosis not present

## 2017-09-16 DIAGNOSIS — E109 Type 1 diabetes mellitus without complications: Secondary | ICD-10-CM | POA: Diagnosis not present

## 2017-09-16 DIAGNOSIS — E890 Postprocedural hypothyroidism: Secondary | ICD-10-CM | POA: Diagnosis not present

## 2017-09-18 DIAGNOSIS — K580 Irritable bowel syndrome with diarrhea: Secondary | ICD-10-CM | POA: Diagnosis not present

## 2017-09-18 DIAGNOSIS — R152 Fecal urgency: Secondary | ICD-10-CM | POA: Diagnosis not present

## 2017-09-18 DIAGNOSIS — E109 Type 1 diabetes mellitus without complications: Secondary | ICD-10-CM | POA: Diagnosis not present

## 2017-09-18 DIAGNOSIS — R159 Full incontinence of feces: Secondary | ICD-10-CM | POA: Diagnosis not present

## 2017-10-07 DIAGNOSIS — R0789 Other chest pain: Secondary | ICD-10-CM | POA: Diagnosis not present

## 2017-10-07 DIAGNOSIS — E119 Type 2 diabetes mellitus without complications: Secondary | ICD-10-CM | POA: Diagnosis not present

## 2017-10-07 DIAGNOSIS — G479 Sleep disorder, unspecified: Secondary | ICD-10-CM | POA: Diagnosis not present

## 2017-10-09 DIAGNOSIS — E039 Hypothyroidism, unspecified: Secondary | ICD-10-CM | POA: Diagnosis not present

## 2017-10-09 DIAGNOSIS — G4700 Insomnia, unspecified: Secondary | ICD-10-CM | POA: Diagnosis not present

## 2017-10-09 DIAGNOSIS — R0602 Shortness of breath: Secondary | ICD-10-CM | POA: Diagnosis not present

## 2017-10-09 DIAGNOSIS — G4730 Sleep apnea, unspecified: Secondary | ICD-10-CM | POA: Diagnosis not present

## 2017-11-04 DIAGNOSIS — R152 Fecal urgency: Secondary | ICD-10-CM | POA: Diagnosis not present

## 2017-11-04 DIAGNOSIS — R159 Full incontinence of feces: Secondary | ICD-10-CM | POA: Diagnosis not present

## 2017-11-04 DIAGNOSIS — K529 Noninfective gastroenteritis and colitis, unspecified: Secondary | ICD-10-CM | POA: Diagnosis not present

## 2017-12-19 DIAGNOSIS — E109 Type 1 diabetes mellitus without complications: Secondary | ICD-10-CM | POA: Diagnosis not present

## 2017-12-19 DIAGNOSIS — E890 Postprocedural hypothyroidism: Secondary | ICD-10-CM | POA: Diagnosis not present

## 2018-01-02 DIAGNOSIS — Z90710 Acquired absence of both cervix and uterus: Secondary | ICD-10-CM | POA: Diagnosis not present

## 2018-01-02 DIAGNOSIS — Z01419 Encounter for gynecological examination (general) (routine) without abnormal findings: Secondary | ICD-10-CM | POA: Diagnosis not present

## 2018-01-02 DIAGNOSIS — M8580 Other specified disorders of bone density and structure, unspecified site: Secondary | ICD-10-CM | POA: Diagnosis not present

## 2018-01-06 DIAGNOSIS — R152 Fecal urgency: Secondary | ICD-10-CM | POA: Diagnosis not present

## 2018-01-06 DIAGNOSIS — L209 Atopic dermatitis, unspecified: Secondary | ICD-10-CM | POA: Diagnosis not present

## 2018-01-06 DIAGNOSIS — E785 Hyperlipidemia, unspecified: Secondary | ICD-10-CM | POA: Diagnosis not present

## 2018-01-06 DIAGNOSIS — K529 Noninfective gastroenteritis and colitis, unspecified: Secondary | ICD-10-CM | POA: Diagnosis not present

## 2018-01-06 DIAGNOSIS — K760 Fatty (change of) liver, not elsewhere classified: Secondary | ICD-10-CM | POA: Diagnosis not present

## 2018-01-06 DIAGNOSIS — Z0000 Encounter for general adult medical examination without abnormal findings: Secondary | ICD-10-CM | POA: Diagnosis not present

## 2018-01-06 DIAGNOSIS — R159 Full incontinence of feces: Secondary | ICD-10-CM | POA: Diagnosis not present

## 2018-01-07 DIAGNOSIS — Z1283 Encounter for screening for malignant neoplasm of skin: Secondary | ICD-10-CM | POA: Diagnosis not present

## 2018-01-07 DIAGNOSIS — L821 Other seborrheic keratosis: Secondary | ICD-10-CM | POA: Diagnosis not present

## 2018-01-07 DIAGNOSIS — L308 Other specified dermatitis: Secondary | ICD-10-CM | POA: Diagnosis not present

## 2018-01-23 DIAGNOSIS — Z1211 Encounter for screening for malignant neoplasm of colon: Secondary | ICD-10-CM | POA: Diagnosis not present

## 2018-01-23 DIAGNOSIS — K529 Noninfective gastroenteritis and colitis, unspecified: Secondary | ICD-10-CM | POA: Diagnosis not present

## 2018-01-23 DIAGNOSIS — K6289 Other specified diseases of anus and rectum: Secondary | ICD-10-CM | POA: Diagnosis not present

## 2018-02-05 DIAGNOSIS — Z1212 Encounter for screening for malignant neoplasm of rectum: Secondary | ICD-10-CM | POA: Diagnosis not present

## 2018-02-05 DIAGNOSIS — Z1231 Encounter for screening mammogram for malignant neoplasm of breast: Secondary | ICD-10-CM | POA: Diagnosis not present

## 2018-02-05 DIAGNOSIS — Z1211 Encounter for screening for malignant neoplasm of colon: Secondary | ICD-10-CM | POA: Diagnosis not present

## 2018-03-04 DIAGNOSIS — I7300 Raynaud's syndrome without gangrene: Secondary | ICD-10-CM | POA: Diagnosis not present

## 2018-03-06 DIAGNOSIS — Z79899 Other long term (current) drug therapy: Secondary | ICD-10-CM | POA: Diagnosis not present

## 2018-03-18 DIAGNOSIS — K7689 Other specified diseases of liver: Secondary | ICD-10-CM | POA: Diagnosis not present

## 2018-03-18 DIAGNOSIS — R1010 Upper abdominal pain, unspecified: Secondary | ICD-10-CM | POA: Diagnosis not present

## 2018-03-24 DIAGNOSIS — E109 Type 1 diabetes mellitus without complications: Secondary | ICD-10-CM | POA: Diagnosis not present

## 2018-03-24 DIAGNOSIS — E890 Postprocedural hypothyroidism: Secondary | ICD-10-CM | POA: Diagnosis not present

## 2018-03-26 DIAGNOSIS — H04123 Dry eye syndrome of bilateral lacrimal glands: Secondary | ICD-10-CM | POA: Diagnosis not present

## 2018-03-26 DIAGNOSIS — H2513 Age-related nuclear cataract, bilateral: Secondary | ICD-10-CM | POA: Diagnosis not present

## 2018-03-26 DIAGNOSIS — E103212 Type 1 diabetes mellitus with mild nonproliferative diabetic retinopathy with macular edema, left eye: Secondary | ICD-10-CM | POA: Diagnosis not present

## 2018-03-26 DIAGNOSIS — E0500 Thyrotoxicosis with diffuse goiter without thyrotoxic crisis or storm: Secondary | ICD-10-CM | POA: Diagnosis not present

## 2018-04-14 DIAGNOSIS — H2513 Age-related nuclear cataract, bilateral: Secondary | ICD-10-CM | POA: Diagnosis not present

## 2018-04-14 DIAGNOSIS — H2512 Age-related nuclear cataract, left eye: Secondary | ICD-10-CM | POA: Diagnosis not present

## 2018-04-28 DIAGNOSIS — H2512 Age-related nuclear cataract, left eye: Secondary | ICD-10-CM | POA: Diagnosis not present

## 2018-04-28 DIAGNOSIS — H25812 Combined forms of age-related cataract, left eye: Secondary | ICD-10-CM | POA: Diagnosis not present

## 2018-05-26 DIAGNOSIS — E890 Postprocedural hypothyroidism: Secondary | ICD-10-CM | POA: Diagnosis not present

## 2018-05-26 DIAGNOSIS — E109 Type 1 diabetes mellitus without complications: Secondary | ICD-10-CM | POA: Diagnosis not present

## 2018-06-02 DIAGNOSIS — Z6822 Body mass index (BMI) 22.0-22.9, adult: Secondary | ICD-10-CM | POA: Diagnosis not present

## 2018-06-02 DIAGNOSIS — K580 Irritable bowel syndrome with diarrhea: Secondary | ICD-10-CM | POA: Diagnosis not present

## 2018-06-02 DIAGNOSIS — I7300 Raynaud's syndrome without gangrene: Secondary | ICD-10-CM | POA: Diagnosis not present

## 2018-06-02 DIAGNOSIS — T691XXD Chilblains, subsequent encounter: Secondary | ICD-10-CM | POA: Diagnosis not present

## 2018-06-02 DIAGNOSIS — R5383 Other fatigue: Secondary | ICD-10-CM | POA: Diagnosis not present

## 2018-06-02 DIAGNOSIS — R768 Other specified abnormal immunological findings in serum: Secondary | ICD-10-CM | POA: Diagnosis not present

## 2018-06-30 DIAGNOSIS — E7800 Pure hypercholesterolemia, unspecified: Secondary | ICD-10-CM | POA: Diagnosis not present

## 2018-06-30 DIAGNOSIS — E109 Type 1 diabetes mellitus without complications: Secondary | ICD-10-CM | POA: Diagnosis not present

## 2018-06-30 DIAGNOSIS — E890 Postprocedural hypothyroidism: Secondary | ICD-10-CM | POA: Diagnosis not present

## 2018-07-06 DIAGNOSIS — R159 Full incontinence of feces: Secondary | ICD-10-CM | POA: Diagnosis not present

## 2018-07-06 DIAGNOSIS — K580 Irritable bowel syndrome with diarrhea: Secondary | ICD-10-CM | POA: Diagnosis not present

## 2018-07-06 DIAGNOSIS — R152 Fecal urgency: Secondary | ICD-10-CM | POA: Diagnosis not present

## 2018-07-07 DIAGNOSIS — Z794 Long term (current) use of insulin: Secondary | ICD-10-CM | POA: Diagnosis not present

## 2018-07-07 DIAGNOSIS — K529 Noninfective gastroenteritis and colitis, unspecified: Secondary | ICD-10-CM | POA: Diagnosis not present

## 2018-07-07 DIAGNOSIS — M65312 Trigger thumb, left thumb: Secondary | ICD-10-CM | POA: Diagnosis not present

## 2018-07-07 DIAGNOSIS — E109 Type 1 diabetes mellitus without complications: Secondary | ICD-10-CM | POA: Diagnosis not present

## 2018-07-07 DIAGNOSIS — E890 Postprocedural hypothyroidism: Secondary | ICD-10-CM | POA: Diagnosis not present

## 2018-07-14 DIAGNOSIS — I7300 Raynaud's syndrome without gangrene: Secondary | ICD-10-CM | POA: Diagnosis not present

## 2018-07-14 DIAGNOSIS — Z6823 Body mass index (BMI) 23.0-23.9, adult: Secondary | ICD-10-CM | POA: Diagnosis not present

## 2018-07-14 DIAGNOSIS — R768 Other specified abnormal immunological findings in serum: Secondary | ICD-10-CM | POA: Diagnosis not present

## 2018-07-14 DIAGNOSIS — R5383 Other fatigue: Secondary | ICD-10-CM | POA: Diagnosis not present

## 2018-07-14 DIAGNOSIS — T691XXD Chilblains, subsequent encounter: Secondary | ICD-10-CM | POA: Diagnosis not present

## 2018-07-24 DIAGNOSIS — M65312 Trigger thumb, left thumb: Secondary | ICD-10-CM | POA: Diagnosis not present

## 2018-07-27 ENCOUNTER — Other Ambulatory Visit: Payer: Self-pay | Admitting: *Deleted

## 2018-07-27 NOTE — Patient Outreach (Signed)
  [facility]) Care Management Chronic Special Needs Program  07/27/2018  Name: Jody Cooke DOB: Oct 08, 1960  MRN: [id number]  Ms. Jody Cooke is enrolled in a chronic special needs plan for  Diabetes. A completed health risk assessment has not been received from the client and client has not responded to outreach attempts by their health care concierge.  The client's individualized care plan was developed based on available data.  Plan:  . Send unsuccessful outreach letter with a copy of individualized care plan to client . Send Emergency planning/management officer to client . Send individualized care plan to provider  Chronic care management coordinator, Thea Silversmith,  will attempt outreach in 2-4 months.    Barrington Ellison RN,CCM,CDE [place] Management Coordinator Office Phone [phone] Office Fax [phone]

## 2018-07-31 ENCOUNTER — Other Ambulatory Visit: Payer: Self-pay | Admitting: *Deleted

## 2018-07-31 NOTE — Patient Outreach (Signed)
[facility]) Care Management  07/31/2018  THYRA YINGER Jul 01, 1960 [id number]  Mrs. Presti called the [facility] Management office requesting to speak with someone to explain the packet of information she received in the mail. This RNCM explained that the services of an RNCM are an added benefit of the [facility] and that the focus of the RNCM is to assist her with any health related questions connect her with services she may need and provide education to help her with self management of her chronic medical conditions.   She states she has a very complicated medical history and sees many providers and she does not think it would be to her advantage to have the assistance of a nurse case manager. She asked if she could refuse the services.  Advised Mrs. Ricotta that this [facility] will consult with leadership about her request and follow up with her early next week. Mrs. Sigman thanked this [facility] for taking the time to explain the [employer] and the purpose of the mailing.  Barrington Ellison RN,CCM,CDE [place] Management Coordinator Office Phone [phone] Office Fax [phone]

## 2018-08-03 ENCOUNTER — Other Ambulatory Visit: Payer: Self-pay

## 2018-08-03 NOTE — Patient Outreach (Signed)
  [facility]) Care Management Chronic Special Needs Program    08/03/2018  Name: Jody Cooke, DOB: 1960-10-27  MRN: [id number]   Ms. Jody Cooke is enrolled in a chronic special needs plan for Diabetes   Called client to follow up on call to [employer] on Friday and to introduce RNCM to client Client is agreeable to [facility] to call back tomorrow to review her care plan and to complete her heath risk assessment. Plan to call client 08/04/18  Jody Garter RN, Jody Cooke, [place] Management [phone]

## 2018-08-04 ENCOUNTER — Other Ambulatory Visit: Payer: Self-pay

## 2018-08-04 NOTE — Patient Outreach (Signed)
  [facility]) Care Management Chronic Special Needs Program  08/04/2018  Name: ALLEYA DEMETER DOB: Jan 28, 1961  MRN: [id number]  Ms. Margaretta Chittum is enrolled in a chronic special needs plan for Diabetes. Chronic Care Management Coordinator telephoned client to review health risk assessment and to develop individualized care plan.  Introduced the chronic care management program, importance of client participation, and taking their care plan to all provider appointments and inpatient facilities.  Reviewed the transition of care process and possible referral to community care management.  Subjective:Client states that she watches her diabetes very closely as she has had diabetes for 55 yrs.  States she feels she has a good handle on her CHO counting.    Goals Addressed            This Visit's Progress   .  Acknowledge receipt of Programme researcher, broadcasting/film/video      . Client understands the importance of follow-up with providers by attending scheduled visits   On track   . Client will use Assistive Devices as needed and verbalize understanding of device use      . HEMOGLOBIN A1C < 7.0       Diabetes self management actions:  Glucose monitoring per provider recommendations  Perform Quality checks on blood meter  Eat Healthy  Check feet daily  Visit provider every 3-6 months as directed  Hbg A1C level every 3-6 months.  Eye Exam yearly    . Obtain annual  Lipid Profile, LDL-C   On track   . Obtain Annual Eye (retinal)  Exam    On track   . Obtain Annual Foot Exam   On track   . Obtain annual screen for micro albuminuria (urine) , nephropathy (kidney problems)   On track   . Obtain Hemoglobin A1C at least 2 times per year   On track   . Visit Primary Care Provider or Endocrinologist at least 2 times per year    On track    Client is not meeting diabetes self management goal of hemoglobin A1C of <7% with last reading with last reading 7.1%  Plan:  Send successful outreach  letter with a copy of their individualized care plan, Send individual care plan to provider and Send educational material  Chronic care management coordination will outreach in:  9 Months    Peter Garter RN, [employer], [place] Management Coordinator [place] Management [phone]

## 2018-08-12 ENCOUNTER — Other Ambulatory Visit: Payer: Self-pay

## 2018-08-13 ENCOUNTER — Ambulatory Visit: Payer: HMO | Admitting: Internal Medicine

## 2018-08-13 ENCOUNTER — Encounter: Payer: Self-pay | Admitting: Internal Medicine

## 2018-08-13 ENCOUNTER — Other Ambulatory Visit: Payer: Self-pay

## 2018-08-13 VITALS — BP 120/70 | HR 74 | Temp 97.8°F | Ht 69.0 in | Wt 156.0 lb

## 2018-08-13 DIAGNOSIS — E1042 Type 1 diabetes mellitus with diabetic polyneuropathy: Secondary | ICD-10-CM

## 2018-08-13 DIAGNOSIS — E890 Postprocedural hypothyroidism: Secondary | ICD-10-CM | POA: Diagnosis not present

## 2018-08-13 LAB — POCT GLYCOSYLATED HEMOGLOBIN (HGB A1C): Hemoglobin A1C: 6.5 % — AB (ref 4.0–5.6)

## 2018-08-13 LAB — T3, FREE: T3, Free: 2.7 pg/mL (ref 2.3–4.2)

## 2018-08-13 LAB — LIPID PANEL
Cholesterol: 229 mg/dL — ABNORMAL HIGH (ref 0–200)
HDL: 71.5 mg/dL (ref >39.00)
LDL Cholesterol: 147 mg/dL — ABNORMAL HIGH (ref 0–99)
NonHDL: 157.2
Total CHOL/HDL Ratio: 3
Triglycerides: 52 mg/dL (ref 0.0–149.0)
VLDL: 10.4 mg/dL (ref 0.0–40.0)

## 2018-08-13 LAB — MICROALBUMIN / CREATININE URINE RATIO
Creatinine,U: 20.9 mg/dL
Microalb Creat Ratio: 3.4 mg/g (ref 0.0–30.0)
Microalb, Ur: <0.7 mg/dL (ref 0.0–1.9)

## 2018-08-13 LAB — T4, FREE: Free T4: 1.28 ng/dL (ref 0.60–1.60)

## 2018-08-13 LAB — TSH: TSH: 3.02 u[IU]/mL (ref 0.35–4.50)

## 2018-08-13 MED ORDER — GLUCAGON (RDNA) 1 MG IJ KIT: 1.0000 mg | PACK | Freq: Once | INTRAMUSCULAR | 12 refills | Status: DC | PRN

## 2018-08-13 MED ORDER — INSULIN ASPART 100 UNIT/ML ~~LOC~~ SOLN: 4.5000 [IU] | Freq: Three times a day (TID) | SUBCUTANEOUS | 3 refills | Status: DC

## 2018-08-13 MED ORDER — GLUCAGON 3 MG/DOSE NA POWD: 3.0000 mg | NASAL | 11 refills | Status: DC | PRN

## 2018-08-13 MED ORDER — INSULIN DETEMIR 100 UNIT/ML ~~LOC~~ SOLN: 20.0000 [IU] | Freq: Every day | SUBCUTANEOUS | 3 refills | Status: DC

## 2018-08-13 NOTE — Patient Instructions (Addendum)
Please continue: - Levemir 20-22 units at bedtime - NovoLog 2-10 units 3x a day before meals  Please let me know if the sugars are consistently <80 or >200.  Please continue Levothyroxine 128 mcg daily.  Take the thyroid hormone every day, with water, at least 30 minutes before breakfast, separated by at least 4 hours from: - acid reflux medications - calcium - iron - multivitamins  Try to move the supplements (multivitamins, calcium) >4h after levothyroxine.  Please come back for a follow-up appointment in 3 months.  Basic Rules for Patients with Type I Diabetes Mellitus  1. The American Diabetes Association (ADA) recommended targets: - fasting sugar <130 - after meal sugar <180 - HbA1C <7%  2. Engage in ?150 min moderate exercise per week  3. Make sure you have ?8h of sleep every night as this helps both blood sugars and your weight.  4. Always keep a sugar log (not only record in your meter) and bring it to all appointments with [place].  5. "15-15 rule" for hypoglycemia: if sugars are low, take 15 g of carbs** ("fast sugar" - e.g. 4 glucose tablets, 4 oz orange juice), wait 15 min, then check sugars again. If still <80, repeat. Continue until your sugars >80, then eat a normal meal.   6. Teach family members and coworkers to inject glucagon. Have a glucagon set at home and one at work. They should call 911 after using the set.  7. Check sugar before driving. If <100, correct, and only start driving if sugars rise ?100. Check sugar every hour when on a long drive.  8. Check sugar before exercising. If <100, correct, and only start exercising if sugars rise ?100. Check sugar every hour when on a long exercise routine and 1h after you finished exercising.   If >250, check urine for ketones. If you have moderate-large ketones in urine, do not start exercise. Hydrate yourself with clear liquids and correct the high sugar. Recheck sugars and ketones before attempting to  exercise.  Be aware that you might need less insulin when exercising.  *intense, short, exercise bursts can increase your sugars, but  *less intense, longer (>1h), exercise routines can decrease your sugars.   9. Make sure you have a MedAlert bracelet or pendant mentioning "Type I Diabetes Mellitus". If you have a prior episode of severe hypoglycemia or hypoglycemia unawareness, it should also mention this.  10. Please do not walk barefoot. Inspect your feet for sores/cuts and let us know if you have them.   **E.g. of "fast carbs": ? first choice (15 g):  1 tube glucose gel, GlucoPouch 15, 2 oz glucose liquid ? second choice (15-16 g):  3 or 4 glucose tablets (best taken  with water), 15 Dextrose Bits chewable ? third choice (15-20 g):   cup fruit juice,  cup regular soda, 1 cup skim milk,  1 cup sports drink ? fourth choice (15-20 g):  1 small tube [place] gel (not frosting), 2 tbsp raisins, 1 tbsp table sugar,  candy, jelly beans, gum drops - check package for carb amount   (adapted from: Lenice Pressman. "Insulin therapy and hypoglycemia" Endocrinol Metab Clin N Am 2012, 41: 57-87)

## 2018-08-13 NOTE — Progress Notes (Signed)
Patient ID: Jody Cooke, female   DOB: Oct 13, 1960, 58 y.o.   MRN: [id number]   HPI: Jody Cooke is a 58 y.o.-year-old female, referred by her PCP, Dr. Ruben Gottron, for management of DM1, uncontrolled, with long term complications (PN), also postablative hypothyroidism. She was previously seen by endocrinology at [facility], Dr. Meredith Pel in 2018, but she needed to change endocrinologists due to insurance restrictions.  She was sick at the beginning of the year (cannot elaborate) >> sugars higher (up to 400s) >> now improving.  DM1: - dx at 58 y/o  Reviewed latest HbA1c levels: 06/30/2018: HbA1c 7.1% 03/24/2018: HbA1c 6.9% 12/19/2017: Hb A1c 6.3% 09/16/2017: HbA1c 6.7% No results found for: HGBA1C  She is on: - Levemir 24 >> 16-19 >> 20-22 units at bedtime - NovoLog 2-10 units 3x a day before meals  Target: 150 (she prefers her sugars to be at or around: 160) ICR: 1:6   ISF: 50  Meter: One Touch Ultra  Pt checks her sugars 3-5x a day and they are - per review of her detailed log: - am: 63-216, 238, 250 - 2h after b'fast: 110 - before lunch: 87-177, 277 - 2h after lunch: n/c - before dinner: 61, 80-190 - 2h after dinner: 72-180, 224, 285 - bedtime: - nighttime: Lowest sugar was 24; she has hypoglycemia awareness at 110. No previous hypoglycemia admission. She does not have  a glucagon kit at home. Highest sugar was 300. No previous DKA admissions.    Pt's meals are: - Breakfast: Cereals - Lunch: Meat and bread - Dinner: Meat, bread, veggies - Snacks: No  - no CKD, last BUN/creatinine:  05/27/2018: BUN 23, GFR 67 Lab Results  Component Value Date   BUN 16 01/06/2017   BUN 8 03/03/2016   CREATININE 0.85 01/06/2017   CREATININE 0.76 03/03/2016   - + HL; last set of lipids: 06/17/2018: 216/94/76/121 No results found for: CHOL, HDL, LDLCALC, LDLDIRECT, TRIG, CHOLHDL   - last eye exam was in 07/2018. No DR. Had 1 cataract removed.  - + numbness and tingling in her feet.  Pt has no FH  of DM.  Postablative hypothyroidism:  Patient was diagnosed with Graves' disease in 2008 and had RAI treatment.  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  She had corrective eye surgery.  She is seeing Dr. Bing Plume.  Last TSH: 05/27/2018: TSH 8.65 03/24/2018: TSH 22.12 No results found for: TSH  Pt is on levothyroxine 128 mcg daily (increased 05/2018), taken: - in am - fasting - at least 2.5 h from b'fast - no Ca, Fe, MVI, PPIs - no Biotin  No h/o radiation tx to head or neck other than RAI treatment for Graves' ds.  No Biotin use. No recent steroids use.   Pt denies: - feeling nodules in neck - hoarseness - dysphagia - choking - SOB with lying down  He also has a history of chronic diarrhea, anal sphincter dysfunction, restless leg syndrome, vitamin D deficiency, dyslexia/ADD, Raynauds, osteopenia, IBS with diarrhea, vitiligo, OA, bursitis, tendinitis.  ROS: Constitutional: + Weight gain, no weight loss, + fatigue, + hot flashes, no subjective hypothermia, + nocturia, + increased thirst Eyes: + Blurry vision, no xerophthalmia ENT: no sore throat, + see HPI, no tinnitus, no hypoacusis Cardiovascular: no CP, no SOB, + palpitations, no leg swelling Respiratory: + cough-allergies, no SOB, no wheezing Gastrointestinal: no N, no V, +  chronic D, no C, no acid reflux Musculoskeletal: no muscle, + joint aches Skin: no rash, no hair loss Neurological: no tremors, no numbness or tingling/no dizziness/+ occasional hAs Psychiatric: no depression, no anxiety  Past Medical History:  Diagnosis Date  . Chronic diarrhea   . Diabetes mellitus   . Grave's disease    Past Surgical History:  Procedure Laterality Date  . ABDOMINAL HYSTERECTOMY    . eye lid surgery     Social History   Socioeconomic History  . Marital status: Married    Spouse name: Not on file  . Number of children:  Not on file  . Years of education: Not on file  . Highest education level: Not on file  Occupational History  .  Disabled  Social Needs  . Financial resource strain: Not on file  . Food insecurity:    Worry: Never true    Inability: Never true  . Transportation needs:    Medical: No    Non-medical: No  Tobacco Use  . Smoking status: Former Smoker    Packs/day: 1.00    Last attempt to quit: 1984    Years since quitting: 35.2  . Smokeless tobacco: Never Used  Substance and Sexual Activity  . Alcohol use: No  . Drug use: No  . Sexual activity: Not on file  Lifestyle  . Physical activity: Walking    Days per week:  Daily   Current Outpatient Medications on File Prior to Visit  Medication Sig Dispense Refill  . acetaminophen (TYLENOL) 650 MG CR tablet Take by mouth.    . Blood Glucose Monitoring Suppl (GLUCOCOM BLOOD GLUCOSE MONITOR) DEVI Use to check blood sugar as directed    . cetirizine (ZYRTEC) 10 MG tablet Take 10 mg by mouth daily.     Marland Kitchen dicyclomine (BENTYL) 10 MG capsule Take by mouth.    . diphenoxylate-atropine (LOMOTIL) 2.5-0.025 MG per tablet Take 1 tablet by mouth 4 (four) times daily as needed. For diarrhea     . FISH OIL-VITAMIN D PO Take 1 tablet by mouth daily.    . Flaxseed Oil (LINSEED OIL) OIL by Does not apply route.    . Fluocinonide Emulsified Base 0.05 % CREA     . glucose blood (ONE TOUCH ULTRA TEST) test strip Use to check blood sugar 6 time(s) daily.    . insulin aspart (NOVOLOG) 100 UNIT/ML injection Inject 4.5-10 Units into the skin 3 (three) times daily before meals. Patient is on a sliding scale and she states that the range changes daily.    . insulin detemir (LEVEMIR) 100 UNIT/ML injection Inject 19.5 Units into the skin daily.     . Insulin Syringe-Needle U-100 (BD INSULIN SYRINGE U/F) 30G X 1/2" 0.5 ML MISC USE TO INJECT INSULIN UP TO 6 TIMES DAILY    . Lactobacillus Rhamnosus, GG, (CULTURELLE) CAPS Take by mouth.    . Lancets (ONETOUCH  ULTRASOFT) lancets     . levothyroxine (SYNTHROID, LEVOTHROID) 112 MCG tablet Take 0.112 mcg by mouth daily.    . Loteprednol Etabonate (LOTEMAX SM) 0.38 % GEL     . mometasone (ELOCON) 0.1 % cream Apply 1 application topically daily.      Marland Kitchen NIACIN PO Take 1 tablet by mouth daily. 30 to 40 mg daily/Pink [employer]    . Omega-3 1000 MG CAPS Take by mouth.    Marland Kitchen PARoxetine (PAXIL) 20 MG tablet Take by mouth.    . triamcinolone (NASACORT) 55 MCG/ACT AERO nasal inhaler  Place 1 spray into the nose daily.    Marland Kitchen trimethoprim (TRIMPEX) 100 MG tablet Take 100 mg by mouth daily as needed.     No current facility-administered medications on file prior to visit.    Allergies  Allergen Reactions  . Aspirin Anaphylaxis  . Milk-Related Compounds     *also allergic to soy milk  And rice milk   . Other   . Penicillins Other (See Comments)    Allergic since childhood  . Sulfa Antibiotics Diarrhea    Tree nuts  . Wheat Extract Diarrhea   Family History  Problem Relation Age of Onset  . Alzheimer's disease Mother   . Hypertension Mother   . Scoliosis Mother   . Diabetes Mother   . Hernia Father    PE: BP 120/70   Pulse 74   Temp 97.8 F (36.6 C)   Ht _0  (1.753 m)   Wt 156 lb (70.8 kg)   LMP 12/10/2011   SpO2 98%   BMI 23.04 kg/m  Wt Readings from Last 3 Encounters:  08/13/18 156 lb (70.8 kg)  01/14/17 180 lb 6.4 oz (81.8 kg)  01/06/17 183 lb (83 kg)   Constitutional: overweight, in NAD Eyes: PERRLA, EOMI, + bilateral exophthalmos, no stare ENT: moist mucous membranes, no thyromegaly, no neck masses, no cervical lymphadenopathy Cardiovascular: RRR, No MRG Respiratory: CTA B Gastrointestinal: abdomen soft, NT, ND, BS+ Musculoskeletal: no deformities, strength intact in all 4 Skin: moist, warm, no rashes Neurological: no tremor with outstretched hands, DTR normal in all 4  ASSESSMENT: 1. DM1, uncontrolled, with complications - PN  2. Postablative hypothyroidism  PLAN:   1. Patient with long-standing, uncontrolled DM1, on basal-bolus insulin therapy. -She keeps great records, documenting her sugars, the exact insulin doses administered, and also occasionally her food. -She had higher blood sugars when she was sick 2 weeks ago, but they improved now.  She has occasional spikes in the 200s, but overall, sugars are at or close to goal.  She is adamant about maintaining her sugars around 160, which she noticed works well for her.  Reviewing her actual sliding scale (she has a printout), her target is 150, sensitivity factor 50, and, per her description, she uses an insulin to carb ratio of approximately 6, however, she does me that she guides the mealtime insulin based on previous experience with particular meals.  She got used to this regimen and I think is working fairly well for her.  She tells me that she is dyslexic and has a hard time calculating doses.  However, for now, there is no reason to change her regimen. - We discussed about changes to her insulin regimen, as follows:  Patient Instructions  Please continue: - Levemir 20-22 units at bedtime - NovoLog 2-10 units 3x a day before meals  Please let me know if the sugars are consistently <80 or >200.  Please continue Levothyroxine 128 mcg daily.  Take the thyroid hormone every day, with water, at least 30 minutes before breakfast, separated by at least 4 hours from: - acid reflux medications - calcium - iron - multivitamins  Try to move the supplements (multivitamins, calcium) >4h after levothyroxine.  Please come back for a follow-up appointment in 3 months.  - continue checking sugars at different times of the day - check at least 4 times a day, rotating checks - given sugar log and advised how to fill it and to bring it at next appt  - given foot care  handout and explained the principles  - given instructions for hypoglycemia management "15-15 rule"  - advised for yearly eye exams - sent  glucagon kit Rx to pharmacy-we will also try the intranasal glucagon to see if this is covered - advised to get ketone strips - advised to always have Glu tablets with her - advised for a Med-alert bracelet mentioning "type 1 diabetes mellitus". - given instruction Re: exercising and driving in DM1 (pt instructions) - will check annual labs today - Return to clinic in 3 mo with sugar log   2. Postablative hypothyroidism - after RAI tx for Graves ds in 2008 - she does have a h/o Graves ophthalmopathy - stable - latest thyroid labs reviewed with pt >> TSH high, at 22.12 in 03/2018, then 8.65 in 05/2018. - she is on LT4 128 mcg daily (in 03/2018, she added half an extra levothyroxine tablet a week, and in 05/2018 she added 1 extra levothyroxine tablet a week). - pt feels good on this dose. - we discussed about taking the thyroid hormone every day, with water, >30 minutes before breakfast, separated by >4 hours from acid reflux medications, calcium, iron, multivitamins. Pt. is taking it correctly, but states supplements with calcium approximately 2.5 hours after levothyroxine we discussed about ideally taking them more than 4 hours later. - will check thyroid tests today: TSH, fT3,  and fT4 - If labs are abnormal, she will need to return for repeat TFTs in 1.5 months  Component     Latest Ref Rng & Units 08/13/2018  Glucose     65 - 99 mg/dL 63 (L)  BUN     7 - 25 mg/dL 14  Creatinine     0.50 - 1.05 mg/dL 0.84  GFR, Est Non African American     > OR = 60 mL/min/1.38m 76  GFR, Est African American     > OR = 60 mL/min/1.754m88  BUN/Creatinine Ratio     6 - 22 (calc) NOT APPLICABLE  Sodium     13681 146 mmol/L 142  Potassium     3.5 - 5.3 mmol/L 3.9  Chloride     98 - 110 mmol/L 104  CO2     20 - 32 mmol/L 27  Calcium     8.6 - 10.4 mg/dL 10.1  Total Protein     6.1 - 8.1 g/dL 7.5  Albumin MSPROF     3.6 - 5.1 g/dL 4.5  Globulin     1.9 - 3.7 g/dL (calc) 3.0  AG Ratio      1.0 - 2.5 (calc) 1.5  Total Bilirubin     0.2 - 1.2 mg/dL 0.5  Alkaline phosphatase (APISO)     37 - 153 U/L 63  AST     10 - 35 U/L 19  ALT     6 - 29 U/L 13  Cholesterol     0 - 200 mg/dL 229 (H)  Triglycerides     0.0 - 149.0 mg/dL 52.0  HDL Cholesterol     >39.00 mg/dL 71.50  VLDL     0.0 - 40.0 mg/dL 10.4  LDL (calc)     0 - 99 mg/dL 147 (H)  Total CHOL/HDL Ratio      3  NonHDL      157.20  Microalb, Ur     0.0 - 1.9 mg/dL <0.7  Creatinine,U     mg/dL 20.9  MICROALB/CREAT RATIO     0.0 - 30.0 mg/g  3.4  TSH     0.35 - 4.50 uIU/mL 3.02  T4,Free(Direct)     0.60 - 1.60 ng/dL 1.28  Triiodothyronine,Free,Serum     2.3 - 4.2 pg/mL 2.7  Hemoglobin A1C     4.0 - 5.6 % 6.5 (A)   TFTs now normal. We can continue the same LT4 regimen for now. LDL above target, at 147. Glu slightly low.   Philemon Kingdom, MD PhD [facility] Endocrinology

## 2018-08-14 ENCOUNTER — Telehealth: Payer: Self-pay | Admitting: Internal Medicine

## 2018-08-14 LAB — COMPLETE METABOLIC PANEL WITH GFR
AG Ratio: 1.5 (calc) (ref 1.0–2.5)
ALT: 13 U/L (ref 6–29)
AST: 19 U/L (ref 10–35)
Albumin: 4.5 g/dL (ref 3.6–5.1)
Alkaline phosphatase (APISO): 63 U/L (ref 37–153)
BUN: 14 mg/dL (ref 7–25)
CO2: 27 mmol/L (ref 20–32)
Calcium: 10.1 mg/dL (ref 8.6–10.4)
Chloride: 104 mmol/L (ref 98–110)
Creat: 0.84 mg/dL (ref 0.50–1.05)
GFR, Est African American: 88 mL/min/{1.73_m2} (ref >=60)
GFR, Est Non African American: 76 mL/min/{1.73_m2} (ref >=60)
Globulin: 3 g/dL (calc) (ref 1.9–3.7)
Glucose, Bld: 63 mg/dL — ABNORMAL LOW (ref 65–99)
Potassium: 3.9 mmol/L (ref 3.5–5.3)
Sodium: 142 mmol/L (ref 135–146)
Total Bilirubin: 0.5 mg/dL (ref 0.2–1.2)
Total Protein: 7.5 g/dL (ref 6.1–8.1)

## 2018-08-14 NOTE — Telephone Encounter (Signed)
Patient is calling requesting to speak the nurse to clarify her after visit summary she received from [place].  Please Advise, Thanks

## 2018-08-17 MED ORDER — "INSULIN SYRINGE-NEEDLE U-100 30G X 1/2"" 0.5 ML MISC": 4 refills | Status: DC

## 2018-08-17 MED ORDER — GLUCOSE BLOOD VI STRP: ORAL_STRIP | 11 refills | Status: DC

## 2018-08-17 MED ORDER — LEVOTHYROXINE SODIUM 112 MCG PO TABS: 112.0000 ug | ORAL_TABLET | Freq: Every day | ORAL | 1 refills | Status: DC

## 2018-08-17 NOTE — Telephone Encounter (Signed)
Left message for patient to return our call at [phone].  

## 2018-08-17 NOTE — Telephone Encounter (Signed)
Melissa, can you please call pt:  TFTs now normal. We can continue the same LT4 regimen for now.  LDL above target, at 147. Did she take a statin before - would she agree to start one?  Glu slightly low, at 63.   Notified patient of message from Dr. Cruzita Lederer, patient expressed understanding and agreement. No further questions.  Patient also needs refills on test strips and needles.

## 2018-08-20 DIAGNOSIS — I878 Other specified disorders of veins: Secondary | ICD-10-CM | POA: Diagnosis not present

## 2018-08-20 DIAGNOSIS — E109 Type 1 diabetes mellitus without complications: Secondary | ICD-10-CM | POA: Diagnosis not present

## 2018-08-26 ENCOUNTER — Other Ambulatory Visit: Payer: Self-pay | Admitting: Internal Medicine

## 2018-08-26 ENCOUNTER — Telehealth: Payer: Self-pay | Admitting: Internal Medicine

## 2018-08-26 MED ORDER — LEVOTHYROXINE SODIUM 112 MCG PO TABS: ORAL_TABLET | ORAL | 1 refills | Status: DC

## 2018-08-26 NOTE — Telephone Encounter (Signed)
I re-sent the Rx.  Regarding the dosing, please see my note: - she is on LT4 128 mcg daily (in 03/2018, she added half an extra levothyroxine tablet a week, and in 05/2018 she added 1 extra levothyroxine tablet a week).

## 2018-08-26 NOTE — Telephone Encounter (Signed)
I spoke with the patient and let her know of this note. She is grateful for the resend.

## 2018-08-26 NOTE — Telephone Encounter (Signed)
Chart notes shows taking 128 MCG, I tried to send it but the system tells me that dose can not be sent?  Please advise.

## 2018-08-26 NOTE — Telephone Encounter (Signed)
Per [facility] patient states "she is a new patient for Dr Cruzita Lederer, she picked up her presriptions today and the Levothyroxine is for 112 mcg, which is not what she has been taking"

## 2018-10-29 DIAGNOSIS — E0501 Thyrotoxicosis with diffuse goiter with thyrotoxic crisis or storm: Secondary | ICD-10-CM | POA: Diagnosis not present

## 2018-10-29 DIAGNOSIS — H04123 Dry eye syndrome of bilateral lacrimal glands: Secondary | ICD-10-CM | POA: Diagnosis not present

## 2018-10-29 DIAGNOSIS — E0500 Thyrotoxicosis with diffuse goiter without thyrotoxic crisis or storm: Secondary | ICD-10-CM | POA: Diagnosis not present

## 2018-10-29 DIAGNOSIS — E103212 Type 1 diabetes mellitus with mild nonproliferative diabetic retinopathy with macular edema, left eye: Secondary | ICD-10-CM | POA: Diagnosis not present

## 2018-11-05 DIAGNOSIS — M65312 Trigger thumb, left thumb: Secondary | ICD-10-CM | POA: Diagnosis not present

## 2018-12-01 ENCOUNTER — Other Ambulatory Visit: Payer: Self-pay

## 2018-12-03 ENCOUNTER — Ambulatory Visit (INDEPENDENT_AMBULATORY_CARE_PROVIDER_SITE_OTHER): Payer: HMO | Admitting: Internal Medicine

## 2018-12-03 ENCOUNTER — Other Ambulatory Visit: Payer: Self-pay

## 2018-12-03 ENCOUNTER — Encounter: Payer: Self-pay | Admitting: Internal Medicine

## 2018-12-03 VITALS — BP 120/70 | HR 72 | Ht 69.0 in | Wt 148.0 lb

## 2018-12-03 DIAGNOSIS — E890 Postprocedural hypothyroidism: Secondary | ICD-10-CM | POA: Diagnosis not present

## 2018-12-03 DIAGNOSIS — E1042 Type 1 diabetes mellitus with diabetic polyneuropathy: Secondary | ICD-10-CM | POA: Diagnosis not present

## 2018-12-03 LAB — POCT GLYCOSYLATED HEMOGLOBIN (HGB A1C): Hemoglobin A1C: 6.6 % — AB (ref 4.0–5.6)

## 2018-12-03 LAB — TSH: TSH: 13.23 u[IU]/mL — ABNORMAL HIGH (ref 0.35–4.50)

## 2018-12-03 LAB — T4, FREE: Free T4: 0.9 ng/dL (ref 0.60–1.60)

## 2018-12-03 NOTE — Addendum Note (Signed)
Addended by: Cardell Peach I on: 12/03/2018 04:54 PM   Modules accepted: Orders

## 2018-12-03 NOTE — Patient Instructions (Addendum)
Please continue: - Levemir 16-16.5 units at bedtime - NovoLog 0.5-5 units 3x a day before meals  Please continue Levothyroxine 128 mcg daily.  Take the thyroid hormone every day, with water, at least 30 minutes before breakfast, separated by at least 4 hours from: - acid reflux medications - calcium - iron - multivitamins  Please come back for a follow-up appointment in 3-4 months.

## 2018-12-03 NOTE — Progress Notes (Signed)
Patient ID: Jody Cooke, female   DOB: [birth year], 58 y.o.   MRN: [id number]   HPI: Jody Cooke is a 58 y.o.-year-old female, initially referred by her PCP, Dr. Ruben Gottron, for management of DM1, uncontrolled, with long term complications (PN), also postablative hypothyroidism.  Last visit 3 months ago. She was previously seen by endocrinology at [facility], Dr. Meredith Pel in 2018, but she needed to change endocrinologists due to insurance restrictions.  She lost 8 lbs since last OV, unintentional. Previous celiac testing was negative.  Recent dx with Raynaud's ds - her 4th autoimmune ds.  DM1: - dx at 58 years old  Reviewed latest HbA1c levels: Lab Results  Component Value Date   HGBA1C 6.5 (A) 08/13/2018  06/30/2018: HbA1c 7.1% 03/24/2018: HbA1c 6.9% 12/19/2017: Hb A1c 6.3% 09/16/2017: HbA1c 6.7%  She is on: - Levemir (vial)24 >> 16-19 >> 20-22 >> 16-16.5 units at bedtime - NovoLog 2-10 >> 0.5-5 units 3x a day before meals   (she prefers her sugars to be at or around: 160)   Meter: One Touch Ultra.  She cannot afford a CGM.  Pt checks her sugars 3-5 times a day per review of her detailed log: - am: 63-216, 238, 250 >> 46, 61-210, 313 - 2h after b'fast: 110 >> n/c - before lunch: 87-177, 277 >> 66-196, 202, 331 - 2h after lunch: n/c - before dinner: 61, 80-190 >> 63-204, 259 - 2h after dinner: 72-180, 224, 285 >> 69-222, 244 - bedtime: see above - nighttime: 105-246 Lowest sugar was 24  >> 46; she has hypoglycemia awareness at 110.  No previous hypoglycemia admission.  She has a glucagon kit at home. Highest sugar was 300 >> 331.  No previous DKA admissions.    Pt's meals are: - Breakfast: Cereals - Lunch: Meat and bread - Dinner: Meat, bread, veggies - Snacks: No  -No CKD, last BUN/creatinine:  Lab Results  Component Value Date   BUN 14 08/13/2018   BUN 16 01/06/2017   CREATININE 0.84 08/13/2018   CREATININE 0.85 01/06/2017   -+ HL; last set of lipids: Lab Results  Component Value  Date   CHOL 229 (H) 08/13/2018   HDL 71.50 08/13/2018   LDLCALC 147 (H) 08/13/2018   TRIG 52.0 08/13/2018   CHOLHDL 3 08/13/2018  06/17/2018: 216/94/76/121 Refuses statins 2/2 negative publicity.  - last eye exam was in  07/2018: No DR. Had 1 cataract removed.  - + numbness and tingling in her feet.  Pt has no FH of DM.  Postablative hypothyroidism:  Patient was diagnosed with Graves' disease in 2008 and had RAI treatment then.  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  She had to have corrective eye surgery.  She is seeing Dr. Bing Plume.  Reviewed TSH levels: Lab Results  Component Value Date   TSH 3.02 08/13/2018  05/27/2018: TSH 8.65 03/24/2018: TSH 22.12  Pt is on levothyroxine 128 mcg daily, taken (8 levothyroxine tablets of 112 mcg/week): - in am - fasting - at least 30 min from b'fast - no Ca, Fe, MVI, PPIs - not on Biotin  No FH of thyroid cancer. No h/o radiation tx to head or neck other than RAI treatment for Graves' disease.  No seaweed or kelp. No recent contrast studies. No Biotin use. No recent steroids use.   Pt denies: - feeling nodules in neck - hoarseness - dysphagia - choking - SOB with lying down  He also has a history of chronic diarrhea, anal sphincter  dysfunction, restless leg syndrome, vitamin D deficiency, dyslexia/ADD, Raynauds, osteopenia, IBS with diarrhea, vitiligo, OA, bursitis, tendinitis.  ROS: Constitutional: no weight gain/+ weight loss, no fatigue, + subjective hyperthermia, no subjective hypothermia Eyes: no blurry vision, no xerophthalmia ENT: no sore throat, + see HPI Cardiovascular: no CP/no SOB/no palpitations/no leg swelling Respiratory: no cough/no SOB/no wheezing Gastrointestinal: no N/no V/no D/no C/no acid reflux Musculoskeletal: no muscle aches/no joint aches Skin: no rashes, no hair loss Neurological: no tremors/no numbness/no tingling/no dizziness  I reviewed pt's medications, allergies, PMH,  social hx, family hx, and changes were documented in the history of present illness. Otherwise, unchanged from my initial visit note.  Past Medical History:  Diagnosis Date  . Chronic diarrhea   . Diabetes mellitus   . Grave's disease    Past Surgical History:  Procedure Laterality Date  . ABDOMINAL HYSTERECTOMY    . eye lid surgery     Social History   Socioeconomic History  . Marital status: Married    Spouse name: Not on file  . Number of children: Not on file  . Years of education: Not on file  . Highest education level: Not on file  Occupational History  .  Disabled  Social Needs  . Financial resource strain: Not on file  . Food insecurity:    Worry: Never true    Inability: Never true  . Transportation needs:    Medical: No    Non-medical: No  Tobacco Use  . Smoking status: Former Smoker    Packs/day: 1.00    Last attempt to quit: 1984    Years since quitting: 35.2  . Smokeless tobacco: Never Used  Substance and Sexual Activity  . Alcohol use: No  . Drug use: No  . Sexual activity: Not on file  Lifestyle  . Physical activity: Walking    Days per week:  Daily   Current Outpatient Medications on File Prior to Visit  Medication Sig Dispense Refill  . acetaminophen (TYLENOL) 650 MG CR tablet Take by mouth.    . diphenoxylate-atropine (LOMOTIL) 2.5-0.025 MG per tablet Take 1 tablet by mouth 4 (four) times daily as needed. For diarrhea     . FISH OIL-VITAMIN D PO Take 1 tablet by mouth daily.    Marland Kitchen glucagon (GLUCAGON EMERGENCY) 1 MG injection Inject 1 mg into the muscle once as needed for up to 1 dose. 1 each 12  . glucose blood (ONE TOUCH ULTRA TEST) test strip Use to check blood sugar 6 time(s) daily. 500 each 11  . insulin aspart (NOVOLOG) 100 UNIT/ML injection Inject 4.5-10 Units into the skin 3 (three) times daily before meals. 30 mL 3  . insulin detemir (LEVEMIR) 100 UNIT/ML injection Inject 0.2-0.22 mLs (20-22 Units total) into the skin daily. 30 mL 3  .  Insulin Syringe-Needle U-100 (BD INSULIN SYRINGE U/F) 30G X 1/2" 0.5 ML MISC USE TO INJECT INSULIN UP TO 6 TIMES DAILY 200 each 4  . Lactobacillus Rhamnosus, GG, (CULTURELLE) CAPS Take by mouth.    . Lancets (ONETOUCH ULTRASOFT) lancets     . levothyroxine (SYNTHROID, LEVOTHROID) 112 MCG tablet Take 112 mcg 7 days a week and 224 mcg 1 day a week (total: 8 tabs a week) 120 tablet 1  . mometasone (ELOCON) 0.1 % cream Apply 1 application topically daily.      . Omega-3 1000 MG CAPS Take by mouth.     No current facility-administered medications on file prior to visit.    Allergies  Allergen Reactions  . Aspirin Anaphylaxis  . Milk-Related Compounds     *also allergic to soy milk  And rice milk   . Other   . Penicillins Other (See Comments)    Allergic since childhood  . Sulfa Antibiotics Diarrhea    Tree nuts  . Wheat Extract Diarrhea   Family History  Problem Relation Age of Onset  . Alzheimer's disease Mother   . Hypertension Mother   . Scoliosis Mother   . Diabetes Mother   . Hernia Father    PE: BP 120/70   Pulse 72   Ht '5\' 9"'  (1.753 m)   Wt 148 lb (67.1 kg)   LMP 12/10/2011   SpO2 95%   BMI 21.86 kg/m  Wt Readings from Last 3 Encounters:  12/03/18 148 lb (67.1 kg)  08/13/18 156 lb (70.8 kg)  01/14/17 180 lb 6.4 oz (81.8 kg)   Constitutional:  Normal weight, in NAD Eyes: PERRLA, EOMI, + bilateral exophthalmos, no stare ENT: moist mucous membranes, no thyromegaly, no cervical lymphadenopathy Cardiovascular: RRR, No MRG Respiratory: CTA B Gastrointestinal: abdomen soft, NT, ND, BS+ Musculoskeletal: no deformities, strength intact in all 4 Skin: moist, warm, no rashes Neurological: no tremor with outstretched hands, DTR normal in all 4  ASSESSMENT: 1. DM1, uncontrolled, with complications - PN  2. Postablative hypothyroidism  PLAN:  1. Patient with longstanding, previously uncontrolled, type 1 diabetes, on basal/bolus insulin regimen -She continues to keep  great records of her sugars, documenting CBGs, insulin doses administered, and also occasionally her food.  At last visit, she had occasional spikes in the 200s but overall sugars were out of close to goal.  She was adamant about not dating her sugars around 160, which she noticed works the best for her.  She was not sticking to the insulin to carb ratio of 126 but grinding her mealtime insulin based on previous experience with particular meals, which is also acceptable.  We did not change her regimen at that time.  Last HbA1c was 6.5%, improved, then. -At this visit, Sugars continue to be fluctuating, without consistent patterns.  She has had some low blood sugars, in the 40s and 50s, but these are rare, but has had more frequent blood sugars in the 60s so she did decrease the doses of her insulins since last visit.  The low blood sugars are alternating with sugars up to 200s, even few 300s despite her diligently documenting her carbs, insulin doses and exercising regularly.  She is doing the job adjusting the insulin doses based on her sugars before meals and also how much she would eat.  She usually varies between 20 and 25 g of carb per meal.  She occasionally may overcorrect sugar has been more than just in the amount of carbs she gets overcorrection.  She corrects with either 1 Reeses cup or cranberry juice (usually 12 g of carbs). -weay need to decrease the doses of if she starts having more lows.  However, since there is no pattern blood sugars, but now, we will need to continue the same regimen. -I recommended:  Patient Instructions  Please continue: - Levemir 16-16.5 units at bedtime - NovoLog 0.5-5 units 3x a day before meals  Please continue Levothyroxine 128 mcg daily.  Take the thyroid hormone every day, with water, at least 30 minutes before breakfast, separated by at least 4 hours from: - acid reflux medications - calcium - iron - multivitamins  Please come back for a follow-up  appointment  in 3-4 months.   - we checked her HbA1c: 6.6% (slightly higher) - advised to check sugars at different times of the day - 4x a day, rotating check times - advised for yearly eye exams >> she is UTD - return to clinic in 3-4 months  2. Postablative hypothyroidism -Developed after RAI treatment for Graves' disease in 2008 -She does have a history of Graves' ophthalmopathy which is stable - latest thyroid labs reviewed with pt >> normal 08/2018 - she continues on LT4 128 mcg daily (112 mcg daily +1 extra levothyroxine tablet a week) - pt feels good on this dose but does complain of weight loss and we decided to recheck the tests now states that these may need adjustment - we discussed about taking the thyroid hormone every day, with water, >30 minutes before breakfast, separated by >4 hours from acid reflux medications, calcium, iron, multivitamins. Pt. is taking it correctly now.  At last visit she was taking calcium to close to levothyroxine and we moved his 4 hours later.  Office Visit on 12/03/2018  Component Date Value Ref Range Status  . TSH 12/03/2018 13.23* 0.35 - 4.50 uIU/mL Final  . Free T4 12/03/2018 0.90  0.60 - 1.60 ng/dL Final   Comment: Specimens from patients who are undergoing biotin therapy and /or ingesting biotin supplements may contain high levels of biotin.  The higher biotin concentration in these specimens interferes with this Free T4 assay.  Specimens that contain high levels  of biotin may cause false high results for this Free T4 assay.  Please interpret results in light of the total clinical presentation of the patient.     TSH very high compared to 3 months ago despite continuing the same dose of levothyroxine.  I will recheck with her if she missed any doses or if she changed how she is taking the medication, but if not, I would like to repeat the tests in approximately 1 month before changing her dose.  Philemon Kingdom, MD PhD [facility]  Endocrinology

## 2018-12-04 ENCOUNTER — Telehealth: Payer: Self-pay

## 2018-12-04 NOTE — Telephone Encounter (Signed)
-----   Message from Philemon Kingdom, MD sent at 12/03/2018  4:46 PM EDT ----- Lenna Sciara, can you please call pt: Patient's thyroid test (TSH) is much higher than before.  Did she change anything in how she is taking the levothyroxine?  If not, I would like to repeat the tests in 4 weeks to make sure that the tests are still abnormal since they were excellent 3 months ago on the same dose.  Can you please order TSH and free T4 for then?

## 2018-12-04 NOTE — Telephone Encounter (Signed)
Left message for patient to return our call at [phone].  

## 2018-12-07 NOTE — Telephone Encounter (Signed)
Pt is still taking one every day of the week and 2 on sundays at 1.12 mcg. She has not changed any meds that she is taking it with it. She is starting to take it at night.

## 2018-12-09 NOTE — Telephone Encounter (Signed)
Pt returned call. Scheduled for lab draw on 01/05/19. Informed to continue taking medication in the evening to introduce more variability per Dr. Arman Filter instructions. Verbalized acceptance and understanding.

## 2018-12-09 NOTE — Telephone Encounter (Signed)
Called pt to schedule lab appt and to inform about Dr. Arman Filter recommendation. LVM requesting returned call

## 2018-12-09 NOTE — Telephone Encounter (Signed)
Jody Cooke, [please ask her not to move it at night as this introduces much more variability. Let's repeat the labs in ~4 weeks and will need to change the dose then if still abnormal.

## 2018-12-10 DIAGNOSIS — M65312 Trigger thumb, left thumb: Secondary | ICD-10-CM | POA: Diagnosis not present

## 2019-01-05 ENCOUNTER — Other Ambulatory Visit: Payer: Self-pay

## 2019-01-05 ENCOUNTER — Other Ambulatory Visit (INDEPENDENT_AMBULATORY_CARE_PROVIDER_SITE_OTHER): Payer: HMO

## 2019-01-05 ENCOUNTER — Other Ambulatory Visit: Payer: Self-pay | Admitting: Internal Medicine

## 2019-01-05 DIAGNOSIS — E890 Postprocedural hypothyroidism: Secondary | ICD-10-CM

## 2019-01-05 LAB — TSH: TSH: 7.92 u[IU]/mL — ABNORMAL HIGH (ref 0.35–4.50)

## 2019-01-05 LAB — T4, FREE: Free T4: 1.1 ng/dL (ref 0.60–1.60)

## 2019-01-06 ENCOUNTER — Telehealth: Payer: Self-pay

## 2019-01-06 DIAGNOSIS — E890 Postprocedural hypothyroidism: Secondary | ICD-10-CM

## 2019-01-06 NOTE — Telephone Encounter (Signed)
-----   Message from Philemon Kingdom, MD sent at 01/05/2019  4:51 PM EDT ----- Lenna Sciara, can you please call pt: TSH is better, but still high.  At this point, we need to increase her levothyroxine dose from an average of 128 mcg daily to 137 mcg daily.  Can you please take off the previous dose from her medication list and send 137 mcg  to her pharmacy?.  We also need labs in 1.5 months: Can you please order a TSH and free T4?

## 2019-01-07 MED ORDER — LEVOTHYROXINE SODIUM 137 MCG PO TABS: 137.0000 ug | ORAL_TABLET | Freq: Every day | ORAL | 1 refills | Status: DC

## 2019-01-07 NOTE — Telephone Encounter (Signed)
Jody Cooke patient has called regarding this lab work.  Patient states OK to leave message on [phone] if she is unable to answer when you call

## 2019-01-07 NOTE — Telephone Encounter (Signed)
Notified patient of message from Dr. Cruzita Lederer, patient expressed understanding and agreement. No further questions.  Medication changed and lab orders placed.

## 2019-01-08 ENCOUNTER — Telehealth: Payer: Self-pay | Admitting: Internal Medicine

## 2019-01-08 NOTE — Telephone Encounter (Signed)
Levothyroxine has been increased and is calling to confirming dosing instructions for the medication.  Please advise [phone]

## 2019-01-08 NOTE — Telephone Encounter (Signed)
I called & informed patient that 161mcg dosage was correct & explained to her reference range in TSH. Patient has lab appointment 02/25/19 to make sure dose is appropriate.

## 2019-02-11 DIAGNOSIS — Z1231 Encounter for screening mammogram for malignant neoplasm of breast: Secondary | ICD-10-CM | POA: Diagnosis not present

## 2019-02-25 ENCOUNTER — Other Ambulatory Visit: Payer: Self-pay

## 2019-02-25 ENCOUNTER — Other Ambulatory Visit (INDEPENDENT_AMBULATORY_CARE_PROVIDER_SITE_OTHER): Payer: HMO

## 2019-02-25 DIAGNOSIS — E890 Postprocedural hypothyroidism: Secondary | ICD-10-CM

## 2019-02-25 LAB — T4, FREE: Free T4: 1.25 ng/dL (ref 0.60–1.60)

## 2019-02-25 LAB — TSH: TSH: 5.25 u[IU]/mL — ABNORMAL HIGH (ref 0.35–4.50)

## 2019-03-03 ENCOUNTER — Telehealth: Payer: Self-pay

## 2019-03-03 NOTE — Telephone Encounter (Signed)
Patient called in wanting her lab results   Please advise

## 2019-03-04 ENCOUNTER — Encounter: Payer: Self-pay | Admitting: Internal Medicine

## 2019-03-04 DIAGNOSIS — R928 Other abnormal and inconclusive findings on diagnostic imaging of breast: Secondary | ICD-10-CM | POA: Diagnosis not present

## 2019-03-04 DIAGNOSIS — N6002 Solitary cyst of left breast: Secondary | ICD-10-CM | POA: Diagnosis not present

## 2019-03-04 DIAGNOSIS — R922 Inconclusive mammogram: Secondary | ICD-10-CM | POA: Diagnosis not present

## 2019-03-04 NOTE — Telephone Encounter (Signed)
Patient requests to be called at ph# [phone] to given her lab results. Patient states she was told she would be called with her lab results the next day (02/26/19) but it has been over a week.

## 2019-03-04 NOTE — Telephone Encounter (Signed)
Duplicate encounter created in error, please disregard.

## 2019-03-04 NOTE — Telephone Encounter (Signed)
Patient requests that if she does not answer the phone for lab results please leave a detailed message on patient's voice mail

## 2019-03-04 NOTE — Telephone Encounter (Signed)
Her results were mailed.  I called her to inform.

## 2019-04-02 ENCOUNTER — Other Ambulatory Visit: Payer: Self-pay

## 2019-04-06 ENCOUNTER — Encounter: Payer: Self-pay | Admitting: Internal Medicine

## 2019-04-06 ENCOUNTER — Ambulatory Visit (INDEPENDENT_AMBULATORY_CARE_PROVIDER_SITE_OTHER): Payer: HMO | Admitting: Internal Medicine

## 2019-04-06 VITALS — BP 122/70 | HR 72 | Ht 69.0 in | Wt 152.0 lb

## 2019-04-06 DIAGNOSIS — E890 Postprocedural hypothyroidism: Secondary | ICD-10-CM | POA: Diagnosis not present

## 2019-04-06 DIAGNOSIS — E1042 Type 1 diabetes mellitus with diabetic polyneuropathy: Secondary | ICD-10-CM | POA: Diagnosis not present

## 2019-04-06 DIAGNOSIS — E785 Hyperlipidemia, unspecified: Secondary | ICD-10-CM

## 2019-04-06 LAB — LIPID PANEL
Cholesterol: 187 mg/dL (ref 0–200)
HDL: 78.7 mg/dL (ref >39.00)
LDL Cholesterol: 102 mg/dL — ABNORMAL HIGH (ref 0–99)
NonHDL: 108.38
Total CHOL/HDL Ratio: 2
Triglycerides: 32 mg/dL (ref 0.0–149.0)
VLDL: 6.4 mg/dL (ref 0.0–40.0)

## 2019-04-06 LAB — MICROALBUMIN / CREATININE URINE RATIO
Creatinine,U: 17.2 mg/dL
Microalb Creat Ratio: 4.1 mg/g (ref 0.0–30.0)
Microalb, Ur: <0.7 mg/dL (ref 0.0–1.9)

## 2019-04-06 LAB — POCT GLYCOSYLATED HEMOGLOBIN (HGB A1C): Hemoglobin A1C: 6.4 % — AB (ref 4.0–5.6)

## 2019-04-06 LAB — TSH: TSH: 2.27 u[IU]/mL (ref 0.35–4.50)

## 2019-04-06 LAB — T4, FREE: Free T4: 1.24 ng/dL (ref 0.60–1.60)

## 2019-04-06 NOTE — Patient Instructions (Signed)
Please continue: - Levemir 15 units at bedtime - NovoLog 0.5-5.5 units 3x a day before meals (try to use less insulin with dinner)  Please continue Levothyroxine 137 mcg daily.  Take the thyroid hormone every day, with water,  separated by at least 4 hours from: - acid reflux medications - calcium - iron - multivitamins  Please stop at the lab.  Please come back for a follow-up appointment in 3-4 months.

## 2019-04-06 NOTE — Progress Notes (Signed)
Patient ID: Jody Cooke, female   DOB: [birth year], 58 y.o.   MRN: [id number]   This visit occurred during the SARS-CoV-2 public health emergency.  Safety protocols were in place, including screening questions prior to the visit, additional usage of staff PPE, and extensive cleaning of exam room while observing appropriate contact time as indicated for disinfecting solutions.   HPI: Jody Cooke is a 58 y.o.-year-old female, initially referred by her PCP, Dr. Ruben Gottron, for management of DM1, dx'ed at 58 y/o, uncontrolled, with long term complications (PN), also postablative hypothyroidism.  Last visit 4 months ago. She was previously seen by endocrinology at [facility], Dr. Meredith Pel in 2018, but she needed to change endocrinologists due to insurance restrictions.  DM1:  Reviewed her HbA1c levels: Lab Results  Component Value Date   HGBA1C 6.6 (A) 12/03/2018   HGBA1C 6.5 (A) 08/13/2018  06/30/2018: HbA1c 7.1% 03/24/2018: HbA1c 6.9% 12/19/2017: Hb A1c 6.3% 09/16/2017: HbA1c 6.7%  She is on: - Levemir 16-16.5 >> 15 units at bedtime (vials) - NovoLog 0.5-5.5 units 3x a day before meals Previously:   (she prefers her sugars to be at or around: 160)   Meter: One Touch Ultra.  She cannot afford the CGM  Pt checks her sugars 3-5 times a day per review of her detailed log -they are still very fluctuating: - am: 63-216, 238, 250 >> 46, 61-210, 313 >> 57-177, 180, 235 - 2h after b'fast: 110 >> n/c - before lunch: 87-177, 277 >> 66-196, 202, 331 >> 80-246 - 2h after lunch: n/c - before dinner: 61, 80-190 >> 63-204, 259 >> 133-253 - 2h after dinner: 72-180, 224, 285 >> 69-222, 244 >> 44, 61-120 - bedtime: see above - nighttime: 105-246 >> 92-210 Lowest sugar was 24 >> 46 >> 40 (2 mo ago), 54; she has hypoglycemia awareness at 60s-100.  No history of hypoglycemia admissions.  She has a nonexpired glucagon kit at home. Highest sugar was 300 >> 331 >> 346.  No previous DKA admissions.    Pt's meals are: -  Breakfast: Cereals - Lunch: Meat and bread - Dinner: Meat, bread, veggies - Snacks: No  -No CKD, last BUN/creatinine:  Lab Results  Component Value Date   BUN 14 08/13/2018   BUN 16 01/06/2017   CREATININE 0.84 08/13/2018   CREATININE 0.85 01/06/2017   -+ HL; last set of lipids: Lab Results  Component Value Date   CHOL 229 (H) 08/13/2018   HDL 71.50 08/13/2018   LDLCALC 147 (H) 08/13/2018   TRIG 52.0 08/13/2018   CHOLHDL 3 08/13/2018  06/17/2018: 216/94/76/121 Refuses statins due to negative publicity.  - last eye exam was in 07/2018: No DR.  Had 1 cataract removed.  -+ Numbness and tingling in her feet.  Pt has no FH of DM.  Postablative hypothyroidism:  She was diagnosed with Graves' disease in 2008, she had RAI treatment then.  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  She had to have corrective eye surgery.  She is seeing Dr. Bing Plume.  She does not complain of blurry vision.  Reviewed TSH levels: Lab Results  Component Value Date   TSH 5.25 (H) 02/25/2019   TSH 7.92 (H) 01/05/2019   TSH 13.23 (H) 12/03/2018   TSH 3.02 08/13/2018  05/27/2018: TSH 8.65 03/24/2018: TSH 22.12  At last visit, she was on levothyroxine 128 mcg daily, taken (8 levothyroxine tablets of 112 mcg/week), but the TSH was elevated, so we increased the dose to 137 mcg daily  12/2018.  She is taking this: - in am >> she moved these at night apparently after our first visit since she cannot keep her supplement at the beginning of the day - fasting - at least 30 min from b'fast - + Calcium with b'fast  - no Fe, MVI, PPIs - not on Biotin  No FH of thyroid cancer. No h/o radiation tx to head or neck other than RAI treatment.  No seaweed or kelp. No recent contrast studies. + herbal supplements (Spleen PMG, Simplex F, Cataplex F, Congaplex, etc). No Biotin use. No recent steroids use.   Pt denies: - feeling nodules in neck - hoarseness - dysphagia - choking - SOB with lying  down  He also has a history of chronic diarrhea, anal sphincter dysfunction, restless leg syndrome, vitamin D deficiency, dyslexia/ADD, osteopenia, IBS with diarrhea, vitiligo, OA, bursitis, tendinitis.  She also has Raynaud's ds - her 4th autoimmune ds.  She feels heart pressure. She saw cardiology >> had a stress test and an event monitor.   ROS: Constitutional: no weight gain/no weight loss, no fatigue,+ subjective hyperthermia, no subjective hypothermia Eyes: no blurry vision, no xerophthalmia ENT: no sore throat, + see HPI Cardiovascular: no CP/no SOB/no palpitations/no leg swelling Respiratory: no cough/no SOB/no wheezing Gastrointestinal: no N/no V/no D/no C/no acid reflux Musculoskeletal: + muscle aches/no joint aches Skin: no rashes, no hair loss Neurological: no tremors/no numbness/no tingling/no dizziness  I reviewed pt's medications, allergies, PMH, social hx, family hx, and changes were documented in the history of present illness. Otherwise, unchanged from my initial visit note.  Past Medical History:  Diagnosis Date  . Chronic diarrhea   . Diabetes mellitus   . Grave's disease    Past Surgical History:  Procedure Laterality Date  . ABDOMINAL HYSTERECTOMY    . eye lid surgery     Social History   Socioeconomic History  . Marital status: Married    Spouse name: Not on file  . Number of children: Not on file  . Years of education: Not on file  . Highest education level: Not on file  Occupational History  .  Disabled  Social Needs  . Financial resource strain: Not on file  . Food insecurity:    Worry: Never true    Inability: Never true  . Transportation needs:    Medical: No    Non-medical: No  Tobacco Use  . Smoking status: Former Smoker    Packs/day: 1.00    Last attempt to quit: 1984    Years since quitting: 35.2  . Smokeless tobacco: Never Used  Substance and Sexual Activity  . Alcohol use: No  . Drug use: No  . Sexual activity: Not on file   Lifestyle  . Physical activity: Walking    Days per week:  Daily   Current Outpatient Medications on File Prior to Visit  Medication Sig Dispense Refill  . acetaminophen (TYLENOL) 650 MG CR tablet Take by mouth.    . diphenoxylate-atropine (LOMOTIL) 2.5-0.025 MG per tablet Take 1 tablet by mouth 4 (four) times daily as needed. For diarrhea     . FISH OIL-VITAMIN D PO Take 1 tablet by mouth daily.    Marland Kitchen glucagon (GLUCAGON EMERGENCY) 1 MG injection Inject 1 mg into the muscle once as needed for up to 1 dose. 1 each 12  . glucose blood (ONE TOUCH ULTRA TEST) test strip Use to check blood sugar 6 time(s) daily. 500 each 11  . insulin aspart (NOVOLOG) 100  UNIT/ML injection Inject 4.5-10 Units into the skin 3 (three) times daily before meals. 30 mL 3  . insulin detemir (LEVEMIR) 100 UNIT/ML injection Inject 0.2-0.22 mLs (20-22 Units total) into the skin daily. 30 mL 3  . Insulin Syringe-Needle U-100 (BD INSULIN SYRINGE U/F) 30G X 1/2" 0.5 ML MISC USE TO INJECT INSULIN UP TO 6 TIMES DAILY 200 each 4  . Lactobacillus Rhamnosus, GG, (CULTURELLE) CAPS Take by mouth.    . Lancets (ONETOUCH ULTRASOFT) lancets     . levothyroxine (LEVO-T) 137 MCG tablet Take 1 tablet (137 mcg total) by mouth daily before breakfast. 90 tablet 1  . mometasone (ELOCON) 0.1 % cream Apply 1 application topically daily.      . Omega-3 1000 MG CAPS Take by mouth.    Marland Kitchen UNABLE TO FIND Congaplex    . UNABLE TO FIND Spleen PMG    . UNABLE TO FIND Med Name: F and F     No current facility-administered medications on file prior to visit.    Allergies  Allergen Reactions  . Aspirin Anaphylaxis  . Milk-Related Compounds     *also allergic to soy milk  And rice milk   . Other   . Penicillins Other (See Comments)    Allergic since childhood  . Sulfa Antibiotics Diarrhea    Tree nuts  . Wheat Extract Diarrhea   Family History  Problem Relation Age of Onset  . Alzheimer's disease Mother   . Hypertension Mother   .  Scoliosis Mother   . Diabetes Mother   . Hernia Father    PE: BP 122/70   Pulse 72   Ht '5\' 9"'  (1.753 m)   Wt 152 lb (68.9 kg)   LMP 12/10/2011   SpO2 98%   BMI 22.45 kg/m  Wt Readings from Last 3 Encounters:  04/06/19 152 lb (68.9 kg)  12/03/18 148 lb (67.1 kg)  08/13/18 156 lb (70.8 kg)   Constitutional: normal weight, in NAD Eyes: PERRLA, EOMI, + B exophthalmos, no stare ENT: moist mucous membranes, no thyromegaly, no cervical lymphadenopathy Cardiovascular: RRR, No MRG Respiratory: CTA B Gastrointestinal: abdomen soft, NT, ND, BS+ Musculoskeletal: no deformities, strength intact in all 4 Skin: moist, warm, no rashes Neurological: no tremor with outstretched hands, DTR normal in all 4  ASSESSMENT: 1. DM1, uncontrolled, with complications - PN  2. Postablative hypothyroidism  PLAN:  1. Patient with longstanding, previously uncontrolled type 1 diabetes, now with better control, on basal-bolus insulin regimen. -She continues to keep rates records of her sugars, documenting CBGs, insulin doses administered, and also occasionally her food. -At last visit, sugars continued to be fluctuating, without consistent patterns.  She had some low blood sugars in the 40s and 50s but these were rare.  Otherwise, sugars fluctuated from the 60s to 200s, even few 300s.  She decreased the doses from the previous visit.  She was doing a great job adjusting the doses based on her sugars before meals and also the carbs eaten.  She was varied between 20 to 25 g of carbs per meal.  Since there was no clear pattern of her blood sugars, we did not change her regimen at that time. -At this visit, her sugars are still fluctuating, with no particular patterns, blood sugars in the morning are usually at close to goal her sugars after dinner are usually lower and she is telling me that she is usually eating out and it is difficult to gauge the insulin dose based on how  much he eats.  We discussed about  relaxing the dose calculation for dinner and injecting slightly less insulin for this meal.  Otherwise, we can continue the current regimen. -I recommended:  Patient Instructions  Please continue: - Levemir 15 units at bedtime - NovoLog 0.5-5.5 units 3x a day before meals (try to use less insulin with dinner)  Please continue Levothyroxine 137 mcg daily.  Take the thyroid hormone every day, with water,  separated by at least 4 hours from: - acid reflux medications - calcium - iron - multivitamins  Please stop at the lab.  Please come back for a follow-up appointment in 3-4 months.  - we checked her HbA1c: 6.4% (lower) - advised to check sugars at different times of the day - 4x a day, rotating check times - advised for yearly eye exams >> she is UTD - UTD with flu shot and pneumonia shot - return to clinic in 3-4 months  2. Postablative hypothyroidism -Developed after RAI treatment for Graves' disease 2008 -She has a history of Graves' ophthalmopathy, which is stable - latest thyroid labs reviewed with pt >> still elevated 5 weeks ago but we did not change the dose then: Lab Results  Component Value Date   TSH 5.25 (H) 02/25/2019  - she continues on LT4 137 mcg daily (dose changed 12/2018).  At last visit, she was taking the equivalent of 128 mcg daily (112 mcg 8/7 days). - pt feels good on this dose but does feel chest pressure, that was investigated by cardiology without clear pathology - we discussed about taking the thyroid hormone every day, with water, around 11 PM (~4 hours after dinner), separated by >4 hours from acid reflux medications, calcium, iron, multivitamins.  We discussed at this visit about the importance of moving levothyroxine in the morning but she would not want to do that because she cannot delayed her supplements until midday.  In this case, we will need to continue with levothyroxine at dinnertime but I advised her that her TFTs will be more fluctuating and  less predictable in this case.  Also, taking the levothyroxine at bedtime can impact her sleep. - will check thyroid tests today: TSH and fT4 - If labs are abnormal, she will need to return for repeat TFTs in 1.5 months  3. HL - Reviewed latest lipid panel and her LDL was quite high Lab Results  Component Value Date   CHOL 229 (H) 08/13/2018   HDL 71.50 08/13/2018   LDLCALC 147 (H) 08/13/2018   TRIG 52.0 08/13/2018   CHOLHDL 3 08/13/2018  -She refuses statins but would like to have another lipid check today to see if she needs to start any natural supplements for hyperlipidemia  Component     Latest Ref Rng & Units 04/06/2019  Glucose     65 - 99 mg/dL 108 (H)  BUN     7 - 25 mg/dL 16  Creatinine     0.50 - 0.99 mg/dL 0.89  GFR, Est Non African American     > OR = 60 mL/min/1.36m 70  GFR, Est African American     > OR = 60 mL/min/1.710m82  BUN/Creatinine Ratio     6 - 22 (calc) NOT APPLICABLE  Sodium     13161 146 mmol/L 141  Potassium     3.5 - 5.3 mmol/L 4.5  Chloride     98 - 110 mmol/L 103  CO2     20 - 32 mmol/L 30  Calcium     8.6 - 10.4 mg/dL 10.2  Total Protein     6.1 - 8.1 g/dL 7.2  Albumin MSPROF     3.6 - 5.1 g/dL 4.4  Globulin     1.9 - 3.7 g/dL (calc) 2.8  AG Ratio     1.0 - 2.5 (calc) 1.6  Total Bilirubin     0.2 - 1.2 mg/dL 0.4  Alkaline phosphatase (APISO)     37 - 153 U/L 58  AST     10 - 35 U/L 18  ALT     6 - 29 U/L 14  Cholesterol     0 - 200 mg/dL 187  Triglycerides     0.0 - 149.0 mg/dL 32.0  HDL Cholesterol     >39.00 mg/dL 78.70  VLDL     0.0 - 40.0 mg/dL 6.4  LDL (calc)     0 - 99 mg/dL 102 (H)  Total CHOL/HDL Ratio      2  NonHDL      108.38  Microalb, Ur     0.0 - 1.9 mg/dL <0.7  Creatinine,U     mg/dL 17.2  MICROALB/CREAT RATIO     0.0 - 30.0 mg/g 4.1  TSH     0.35 - 4.50 uIU/mL 2.27  T4,Free(Direct)     0.60 - 1.60 ng/dL 1.24   Labs are all at goal with the exception of LDL cholesterol which is slightly  above target, but much improved from before.  Philemon Kingdom, MD PhD [facility] Endocrinology

## 2019-04-07 ENCOUNTER — Telehealth: Payer: Self-pay

## 2019-04-07 LAB — COMPLETE METABOLIC PANEL WITH GFR
AG Ratio: 1.6 (calc) (ref 1.0–2.5)
ALT: 14 U/L (ref 6–29)
AST: 18 U/L (ref 10–35)
Albumin: 4.4 g/dL (ref 3.6–5.1)
Alkaline phosphatase (APISO): 58 U/L (ref 37–153)
BUN: 16 mg/dL (ref 7–25)
CO2: 30 mmol/L (ref 20–32)
Calcium: 10.2 mg/dL (ref 8.6–10.4)
Chloride: 103 mmol/L (ref 98–110)
Creat: 0.89 mg/dL (ref 0.50–0.99)
GFR, Est African American: 82 mL/min/{1.73_m2} (ref >=60)
GFR, Est Non African American: 70 mL/min/{1.73_m2} (ref >=60)
Globulin: 2.8 g/dL (calc) (ref 1.9–3.7)
Glucose, Bld: 108 mg/dL — ABNORMAL HIGH (ref 65–99)
Potassium: 4.5 mmol/L (ref 3.5–5.3)
Sodium: 141 mmol/L (ref 135–146)
Total Bilirubin: 0.4 mg/dL (ref 0.2–1.2)
Total Protein: 7.2 g/dL (ref 6.1–8.1)

## 2019-04-07 NOTE — Telephone Encounter (Signed)
-----   Message from Philemon Kingdom, MD sent at 04/07/2019  7:31 AM EST ----- Lenna Sciara, can you please call pt: Labs are all at goal with the exception of LDL (bad) cholesterol which is slightly above target, but MUCH improved from before.

## 2019-04-07 NOTE — Telephone Encounter (Signed)
Notified patient of message from Dr. Gherghe, patient expressed understanding and agreement. No further questions.  

## 2019-04-20 DIAGNOSIS — M8580 Other specified disorders of bone density and structure, unspecified site: Secondary | ICD-10-CM | POA: Diagnosis not present

## 2019-04-20 DIAGNOSIS — N952 Postmenopausal atrophic vaginitis: Secondary | ICD-10-CM | POA: Diagnosis not present

## 2019-04-29 ENCOUNTER — Ambulatory Visit: Payer: Self-pay

## 2019-05-04 ENCOUNTER — Ambulatory Visit: Payer: Self-pay

## 2019-05-05 ENCOUNTER — Other Ambulatory Visit: Payer: Self-pay

## 2019-05-05 NOTE — Patient Outreach (Signed)
  [facility]) Care Management Chronic Special Needs Program  05/05/2019  Name: LEAH GATT DOB: [birth year]  MRN: [id number]  Ms. Buddy Crunkleton is enrolled in a chronic special needs plan for Diabetes. Reviewed and updated care plan.  Subjective: Client states that she has been doing good.  States she is having fewer low readings and she is able to feel when is is low better.  States she saw Dr. Cruzita Lederer on 04/06/19 and her Hemoglobin A1C was down to 6.4%.  States she remembers to take her insulin with meals and does a good job with her CHO counting.  States she has not gotten her Advanced Directives completed yet  Goals Addressed            This Visit's Progress   . COMPLETED:  Acknowledge receipt of Advanced Directive package       Received forms    . COMPLETED: Client understands the importance of follow-up with providers by attending scheduled visits   On track    Reports keeping appointments    . COMPLETED: Client will use Assistive Devices as needed and verbalize understanding of device use   On track    No issues with glucometer reported    . HEMOGLOBIN A1C < 7.0        Diabetes self management actions:  Glucose monitoring per provider recommendations  Perform Quality checks on blood meter  Eat Healthy  Check feet daily  Visit provider every 3-6 months as directed  Hbg A1C level every 3-6 months.  Eye Exam yearly    . COMPLETED: Maintain timely refills of diabetic medication as prescribed within the year .   On track    Maintaining refills of medcations    . COMPLETED: Obtain annual  Lipid Profile, LDL-C       Completed 04/06/19    . COMPLETED: Obtain Annual Eye (retinal)  Exam        Completed 05/27/18    . COMPLETED: Obtain Annual Foot Exam       Completed 06/30/18    . COMPLETED: Obtain annual screen for micro albuminuria (urine) , nephropathy (kidney problems)       Completed 04/06/19    . COMPLETED: Obtain Hemoglobin A1C at least 2 times  per year       Completed 06/05/18, 08/13/18,12/03/18,04/06/19    . COMPLETED: Visit Primary Care Provider or Endocrinologist at least 2 times per year        Endocrinologist visits 08/13/18, 12/03/18, 04/06/19     Client is meeting diabetes self management goal of hemoglobin A1C of <7% with last reading of 6.4% Reinforced CHO counting and portion control Encouraged to continue to get regular exercise Reviewed s/s of hypoglycemia and actions to take Encouraged to get Advanced Directives completed Reviewed number for 24-hour nurse Line Reviewed COVID 19 precautions Plan:  Send successful outreach letter with a copy of their individualized care plan and Send individual care plan to provider  Chronic care management coordinator will outreach in:  6-9 Months     Peter Garter RN, [facility], [place] Management Coordinator [place] [facility] [phone]

## 2019-05-12 ENCOUNTER — Telehealth: Payer: Self-pay | Admitting: Internal Medicine

## 2019-05-12 NOTE — Telephone Encounter (Signed)
MEDICATION: Mometasone 0.1%  PHARMACY:  [facility] [address] in [place] :   IS PATIENT OUT OF MEDICATION: yes  IF NOT; HOW MUCH IS LEFT:   LAST APPOINTMENT DATE: @11 /25/2020  NEXT APPOINTMENT DATE:@3 /30/2021  DO WE HAVE YOUR PERMISSION TO LEAVE A DETAILED MESSAGE: yes - [phone]  OTHER COMMENTS:    **Let patient know to contact pharmacy at the end of the day to make sure medication is ready. **  ** Please notify patient to allow 48-72 hours to process**  **Encourage patient to contact the pharmacy for refills or they can request refills through [facility]**

## 2019-05-13 NOTE — Telephone Encounter (Signed)
Would you like this to be refilled by PCP?

## 2019-05-16 NOTE — Telephone Encounter (Signed)
Yes.  Ty, C

## 2019-05-17 NOTE — Telephone Encounter (Signed)
Please address in Noah's absence

## 2019-05-24 ENCOUNTER — Telehealth: Payer: Self-pay

## 2019-05-24 NOTE — Telephone Encounter (Signed)
Ok to send

## 2019-05-24 NOTE — Telephone Encounter (Signed)
Pharmacy called in needing a new Rx sent in for mometasone (ELOCON) 0.1 % cream   Please advise    [facility] [place], [place] - [address]

## 2019-05-24 NOTE — Telephone Encounter (Signed)
Per PCP 

## 2019-06-09 DIAGNOSIS — M810 Age-related osteoporosis without current pathological fracture: Secondary | ICD-10-CM | POA: Diagnosis not present

## 2019-06-09 DIAGNOSIS — M8589 Other specified disorders of bone density and structure, multiple sites: Secondary | ICD-10-CM | POA: Diagnosis not present

## 2019-06-09 DIAGNOSIS — Z780 Asymptomatic menopausal state: Secondary | ICD-10-CM | POA: Diagnosis not present

## 2019-06-12 DIAGNOSIS — Z20822 Contact with and (suspected) exposure to covid-19: Secondary | ICD-10-CM | POA: Diagnosis not present

## 2019-06-14 ENCOUNTER — Telehealth: Payer: Self-pay | Admitting: *Deleted

## 2019-06-14 NOTE — Telephone Encounter (Signed)
Assisted pt with covid vaccine wait list placement.

## 2019-06-15 DIAGNOSIS — Z6822 Body mass index (BMI) 22.0-22.9, adult: Secondary | ICD-10-CM | POA: Diagnosis not present

## 2019-06-15 DIAGNOSIS — I7300 Raynaud's syndrome without gangrene: Secondary | ICD-10-CM | POA: Diagnosis not present

## 2019-06-15 DIAGNOSIS — R768 Other specified abnormal immunological findings in serum: Secondary | ICD-10-CM | POA: Diagnosis not present

## 2019-06-15 DIAGNOSIS — T691XXD Chilblains, subsequent encounter: Secondary | ICD-10-CM | POA: Diagnosis not present

## 2019-06-17 ENCOUNTER — Other Ambulatory Visit: Payer: Self-pay | Admitting: Internal Medicine

## 2019-06-20 ENCOUNTER — Other Ambulatory Visit: Payer: Self-pay | Admitting: Internal Medicine

## 2019-06-29 DIAGNOSIS — D2339 Other benign neoplasm of skin of other parts of face: Secondary | ICD-10-CM | POA: Diagnosis not present

## 2019-06-29 DIAGNOSIS — D485 Neoplasm of uncertain behavior of skin: Secondary | ICD-10-CM | POA: Diagnosis not present

## 2019-06-29 DIAGNOSIS — L821 Other seborrheic keratosis: Secondary | ICD-10-CM | POA: Diagnosis not present

## 2019-06-29 DIAGNOSIS — L720 Epidermal cyst: Secondary | ICD-10-CM | POA: Diagnosis not present

## 2019-07-12 DIAGNOSIS — K591 Functional diarrhea: Secondary | ICD-10-CM | POA: Diagnosis not present

## 2019-07-14 DIAGNOSIS — J40 Bronchitis, not specified as acute or chronic: Secondary | ICD-10-CM | POA: Diagnosis not present

## 2019-08-06 ENCOUNTER — Other Ambulatory Visit: Payer: Self-pay

## 2019-08-10 ENCOUNTER — Other Ambulatory Visit: Payer: Self-pay

## 2019-08-10 ENCOUNTER — Ambulatory Visit: Payer: HMO | Admitting: Internal Medicine

## 2019-08-10 ENCOUNTER — Encounter: Payer: Self-pay | Admitting: Internal Medicine

## 2019-08-10 VITALS — BP 120/60 | HR 77 | Ht 69.0 in | Wt 148.0 lb

## 2019-08-10 DIAGNOSIS — E890 Postprocedural hypothyroidism: Secondary | ICD-10-CM | POA: Diagnosis not present

## 2019-08-10 DIAGNOSIS — E785 Hyperlipidemia, unspecified: Secondary | ICD-10-CM | POA: Diagnosis not present

## 2019-08-10 DIAGNOSIS — E1042 Type 1 diabetes mellitus with diabetic polyneuropathy: Secondary | ICD-10-CM | POA: Diagnosis not present

## 2019-08-10 LAB — POCT GLYCOSYLATED HEMOGLOBIN (HGB A1C): Hemoglobin A1C: 6.7 % — AB (ref 4.0–5.6)

## 2019-08-10 LAB — LIPID PANEL
Cholesterol: 189 mg/dL (ref 0–200)
HDL: 66.6 mg/dL (ref >39.00)
LDL Cholesterol: 113 mg/dL — ABNORMAL HIGH (ref 0–99)
NonHDL: 122.64
Total CHOL/HDL Ratio: 3
Triglycerides: 46 mg/dL (ref 0.0–149.0)
VLDL: 9.2 mg/dL (ref 0.0–40.0)

## 2019-08-10 LAB — T4, FREE: Free T4: 1.13 ng/dL (ref 0.60–1.60)

## 2019-08-10 LAB — TSH: TSH: 5.1 u[IU]/mL — ABNORMAL HIGH (ref 0.35–4.50)

## 2019-08-10 NOTE — Patient Instructions (Addendum)
Please continue: - Levemir 16-18 units at bedtime - NovoLog 0.5-5.5 units 3x a day before meals.  Please continue Levothyroxine 137 mcg daily.  Take the thyroid hormone every day, with water,  separated by at least 4 hours from: - acid reflux medications - calcium - iron - multivitamins  Please come back for a follow-up appointment in 4 months.

## 2019-08-10 NOTE — Progress Notes (Signed)
Patient ID: Jody Cooke, female   DOB: Sep 27, 1960, 59 y.o.   MRN: [id number]   This visit occurred during the SARS-CoV-2 public health emergency.  Safety protocols were in place, including screening questions prior to the visit, additional usage of staff PPE, and extensive cleaning of exam room while observing appropriate contact time as indicated for disinfecting solutions.   HPI: Jody Cooke is a 59 y.o.-year-old female, initially referred by her PCP, Dr. Ruben Gottron, for management of DM1, dx'ed at 59 y/o, uncontrolled, with long term complications (PN), also postablative hypothyroidism.  Last visit 4 months ago She was previously seen by endocrinology at [facility], Dr. Meredith Pel in 2018, but she needed to change endocrinologists due to insurance restrictions.    Since last visit, she started to wake up at night to check her blood sugar and correct them if needed and she feels better after she started to do so.  She has less palpitations and muscle cramps.  DM1:  Reviewed HbA1c levels: Lab Results  Component Value Date   HGBA1C 6.4 (A) 04/06/2019   HGBA1C 6.6 (A) 12/03/2018   HGBA1C 6.5 (A) 08/13/2018  06/30/2018: HbA1c 7.1% 03/24/2018: HbA1c 6.9% 12/19/2017: Hb A1c 6.3% 09/16/2017: HbA1c 6.7%  She is on: - Levemir 16-16.5 >> 15 >> 14-18 units at bedtime (vials) - NovoLog 0.5-5.5 units 3x a day before meals Previously:   (she prefers her sugars to be at or around: 160)   Meter: One Touch Ultra.  She could not afford the CGM.  Also, she is not interested in one for now.  Pt checks her sugars 3-5 times a day per review of her detailed log.  They are extremely fluctuating: - am: 63-216, 238, 250 >> 46, 61-210, 313 >> 57-177, 180, 235 >> 57, 89-265, 285 - 2h after b'fast: 110 >> n/c - before lunch: 87-177, 277 >> 66-196, 202, 331 >> 80-246 >> 83-217, 240 - 2h after lunch: n/c - before dinner: 61, 80-190 >> 63-204, 259 >> 133-253 >> 82-271 - 2h after dinner: 72-180, 224, 285 >> 69-222, 244 >> 44, 61-120  >> 120-278 - bedtime: see above - nighttime: 105-246 >> 92-210 >> 57, 91-217, 328 Lowest sugar was 24 >> .Marland KitchenMarland Kitchen40 (2 mo ago), 54 >> 40s (at night - but not recently - as she wakes up at night to correct CBGs); she has hypoglycemia awareness at 60-100.  No history of hypoglycemia admissions.  She has an unexpired glucagon kit at home. Highest sugar was 346.  No previous DKA admissions.    Pt's meals are: - Breakfast: Cereals - Lunch: Meat and bread - Dinner: Meat, bread, veggies - Snacks: No  -No CKD, last BUN/creatinine:  Lab Results  Component Value Date   BUN 16 04/06/2019   BUN 14 08/13/2018   CREATININE 0.89 04/06/2019   CREATININE 0.84 08/13/2018   -+ HL; last set of lipids: Lab Results  Component Value Date   CHOL 187 04/06/2019   HDL 78.70 04/06/2019   LDLCALC 102 (H) 04/06/2019   TRIG 32.0 04/06/2019   CHOLHDL 2 04/06/2019  06/17/2018: 216/94/76/121 She refuses statins due to negative publicity.  - last eye exam was in 07/2018: No DR.had one cataract surgery.  -+ Numbness and tingling in her feet.  Pt has no FH of DM.  Postablative hypothyroidism:  She was diagnosed with Graves' disease in 2008 and had RAI treatment then.  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  She had to have corrective eye surgery.  She is seeing Dr. Bing Plume.  No blurry vision  Reviewed her TFTs: Lab Results  Component Value Date   TSH 2.27 04/06/2019   TSH 5.25 (H) 02/25/2019   TSH 7.92 (H) 01/05/2019   TSH 13.23 (H) 12/03/2018   TSH 3.02 08/13/2018  05/27/2018: TSH 8.65 03/24/2018: TSH 22.12  At last visit, she was on levothyroxine 128 mcg daily, taken (8 levothyroxine tablets of 112 mcg/week), but TSH was elevated so increase the dose to 137 mcg daily 12/2018. - At the end of the day as otherwise she cannot take her supplement in the morning and she absolutely needs - at least 30 min from b'fast - + Ca in am - no Fe, MVI, PPIs - not on Biotin  No family  history of thyroid cancer.  No history of radiation therapy to head or neck other than RAI treatment.  She takes herbal supplements (Spleen PMG, Simplex F, Cataplex F, Congaplex, etc).  No biotin.  No recent steroid use.  Pt denies: - feeling nodules in neck - hoarseness - dysphagia - choking - SOB with lying down  He also has a history of chronic diarrhea, anal sphincter dysfunction, restless leg syndrome, vitamin D deficiency, dyslexia/ADD, osteopenia, IBS with diarrhea, vitiligo, OA, bursitis, tendinitis.  She also has Raynaud's disease- her 4th autoimmune ds.  She feels heart pressure. She saw cardiology >> had a stress test and an event monitor without clear pathology found.   She increased her vitamin D to 4,300 units daily. No results found for: VD25OH   Since last visit, she introduce more pork in her diet.  Previously, she was eating chicken and [place].  Her husband is a Biomedical scientist.  ROS: Constitutional: no weight gain/no weight loss, no fatigue, no subjective hyperthermia, no subjective hypothermia Eyes: no blurry vision, no xerophthalmia ENT: no sore throat, + see HPI Cardiovascular: no CP/no SOB/no palpitations/no leg swelling Respiratory: no cough/no SOB/no wheezing Gastrointestinal: no N/no V/no D/no C/no acid reflux Musculoskeletal: no muscle aches/no joint aches Skin: no rashes, no hair loss Neurological: no tremors/no numbness/no tingling/no dizziness  I reviewed pt's medications, allergies, PMH, social hx, family hx, and changes were documented in the history of present illness. Otherwise, unchanged from my initial visit note.  Past Medical History:  Diagnosis Date  . Chronic diarrhea   . Diabetes mellitus   . Grave's disease    Past Surgical History:  Procedure Laterality Date  . ABDOMINAL HYSTERECTOMY    . eye lid surgery     Social History   Socioeconomic History  . Marital status: Married    Spouse name: Not on file  . Number of children: Not on file   . Years of education: Not on file  . Highest education level: Not on file  Occupational History  .  Disabled  Social Needs  . Financial resource strain: Not on file  . Food insecurity:    Worry: Never true    Inability: Never true  . Transportation needs:    Medical: No    Non-medical: No  Tobacco Use  . Smoking status: Former Smoker    Packs/day: 1.00    Last attempt to quit: 1984    Years since quitting: 35.2  . Smokeless tobacco: Never Used  Substance and Sexual Activity  . Alcohol use: No  . Drug use: No  . Sexual activity: Not on file  Lifestyle  . Physical activity: Walking    Days per week:  Daily   Current Outpatient Medications on  File Prior to Visit  Medication Sig Dispense Refill  . acetaminophen (TYLENOL) 650 MG CR tablet Take by mouth.    . BD INSULIN SYRINGE U/F 30G X 1/2" 0.5 ML MISC USE TO INJECT INSULIN UP TO 6 TIMES DAILY 200 each 3  . diphenoxylate-atropine (LOMOTIL) 2.5-0.025 MG per tablet Take 1 tablet by mouth 4 (four) times daily as needed. For diarrhea     . FISH OIL-VITAMIN D PO Take 1 tablet by mouth daily.    Marland Kitchen glucagon (GLUCAGON EMERGENCY) 1 MG injection Inject 1 mg into the muscle once as needed for up to 1 dose. (Patient not taking: Reported on 05/12/2019) 1 each 12  . glucose blood (ONE TOUCH ULTRA TEST) test strip Use to check blood sugar 6 time(s) daily. (Patient not taking: Reported on 05/12/2019) 500 each 11  . insulin aspart (NOVOLOG) 100 UNIT/ML injection Inject 4.5-10 Units into the skin 3 (three) times daily before meals. (Patient not taking: Reported on 05/12/2019) 30 mL 3  . insulin detemir (LEVEMIR) 100 UNIT/ML injection Inject 0.2-0.22 mLs (20-22 Units total) into the skin daily. (Patient not taking: Reported on 05/12/2019) 30 mL 3  . Lactobacillus Rhamnosus, GG, (CULTURELLE) CAPS Take by mouth.    . Lancets (ONETOUCH ULTRASOFT) lancets     . levothyroxine (SYNTHROID) 137 MCG tablet TAKE 1 TABLET BY MOUTH DAILY BEFORE BREAKFAST 90  tablet 0  . mometasone (ELOCON) 0.1 % cream Apply 1 application topically daily.      . Omega-3 1000 MG CAPS Take by mouth.    Marland Kitchen UNABLE TO FIND Congaplex    . UNABLE TO FIND Spleen PMG    . UNABLE TO FIND Med Name: F and F     No current facility-administered medications on file prior to visit.   Allergies  Allergen Reactions  . Aspirin Anaphylaxis  . Milk-Related Compounds     *also allergic to soy milk  And rice milk   . Other   . Penicillins Other (See Comments)    Allergic since childhood  . Sulfa Antibiotics Diarrhea    Tree nuts  . Wheat Extract Diarrhea   Family History  Problem Relation Age of Onset  . Alzheimer's disease Mother   . Hypertension Mother   . Scoliosis Mother   . Diabetes Mother   . Hernia Father    PE: BP 120/60   Pulse 77   Ht 5' 9" (1.753 m)   Wt 148 lb (67.1 kg)   LMP 12/10/2011   SpO2 97%   BMI 21.86 kg/m  Wt Readings from Last 3 Encounters:  08/10/19 148 lb (67.1 kg)  04/06/19 152 lb (68.9 kg)  12/03/18 148 lb (67.1 kg)   Constitutional: Normal weight, in NAD Eyes: PERRLA, EOMI, + B symmetric exophthalmos, no stare ENT: moist mucous membranes, no thyromegaly, no cervical lymphadenopathy Cardiovascular: RRR, No MRG Respiratory: CTA B Gastrointestinal: abdomen soft, NT, ND, BS+ Musculoskeletal: no deformities, strength intact in all 4 Skin: moist, warm, no rashes Neurological: no tremor with outstretched hands, DTR normal in all 4  ASSESSMENT: 1. DM1, uncontrolled, with complications - PN  2. Postablative hypothyroidism  3. HL  PLAN:  1. Patient with longstanding, previously uncontrolled type 1 diabetes, now with better control, on a basal-bolus insulin regimen.  She is very insulin sensitive and uses lower doses of insulin.  She is keeping a very good record of her sugars, CBGs, insulin doses administered and also occasionally.  At last visit, sugars were still fluctuating, without particular  patterns, with a.m. blood sugars  usually Close to Goal and Sugars after Dinner usually lower.  She was eating out and was overshooting her insulin before these meals.  We discussed about relaxing the dose calculation for dinner and injecting slightly less insulin for this meal.  Otherwise, we continued the same regimen.  Her HbA1c at that time was 6.4%, lowered. -At this visit, we reviewed together her in detail blood sugar log and it appears that her sugars are equal more fluctuating than before.  Approximately 50% of her sugars in the morning are close to goal and 50% are higher.  Later in the day, sugars are slightly better controlled and she even has some lower blood sugars in the 50s around dinnertime.  Unfortunately, it is almost impossible to give her the directions about how much insulin to take since she is already doing an excellent job adjusting the doses based on how much she eats, previous patterns, and also sugars before meals.  We discussed that I believe that she will reached the limits to all weekend with her regimen aside an insulin pump. -However, I did strongly recommend a CGM, to help her with blood sugar checks but also with alarms when sugars are starting to increase or decrease.  She will let me know if she decides where this, but for now, she would not want to start it.  She does continue to wake up at night to check her blood sugars and adjust them if they are low or high.  She feels much better after she started to lose so, with less palpitations and muscle leg cramps -I recommended:  Patient Instructions  Please continue: - Levemir 16-18 units at bedtime - NovoLog 0.5-5.5 units 3x a day before meals.  Please continue Levothyroxine 137 mcg daily.  Take the thyroid hormone every day, with water,  separated by at least 4 hours from: - acid reflux medications - calcium - iron - multivitamins  Please come back for a follow-up appointment in 4 months.  - we checked her HbA1c: 6.7% (higher) - advised to  check sugars at different times of the day - 4x a day, rotating check times - advised for yearly eye exams >> she is UTD - return to clinic in 4 months  2. Postablative hypothyroidism -She developed hypothyroidism after RAI treatment in 2008 -She has a history of Graves' ophthalmopathy, which is stable - latest thyroid labs reviewed with pt >> normal: Lab Results  Component Value Date   TSH 2.27 04/06/2019   - she continues on LT4 137 mcg daily (increased from 128 mcg daily-112 mcg 8 out of 7 days) - pt feels good on this dose.  At last visit she complained of chest pressure, which was investigated by cardiology without pathology - we discussed about taking the thyroid hormone every day, with water, >30 minutes before breakfast, separated by >4 hours from acid reflux medications, calcium, iron, multivitamins. Pt. is taking it correctly.  However, she takes it around 11 PM, approximately 4 hours after dinner as she could not delay her morning supplements until midday.  We did discuss in the past that this is more variability in TFTs but she prefers to continue taking it at night. - will check thyroid tests today: TSH and fT4 - If labs are abnormal, she will need to return for repeat TFTs in 1.5 months  3. HL -Regulated lipid panel and LDL was better, only slightly above target Lab Results  Component Value Date  CHOL 187 04/06/2019   HDL 78.70 04/06/2019   LDLCALC 102 (H) 04/06/2019   TRIG 32.0 04/06/2019   CHOLHDL 2 04/06/2019  -She refused statins in the past. -She would want her lipids rechecked today since she changed her diet since last visit, introduced more ipork.  We discussed that this has a significant amount of cholesterol.  Office Visit on 08/10/2019  Component Date Value Ref Range Status  . TSH 08/10/2019 5.10* 0.35 - 4.50 uIU/mL Final  . Free T4 08/10/2019 1.13  0.60 - 1.60 ng/dL Final   Comment: Specimens from patients who are undergoing biotin therapy and /or ingesting  biotin supplements may contain high levels of biotin.  The higher biotin concentration in these specimens interferes with this Free T4 assay.  Specimens that contain high levels  of biotin may cause false high results for this Free T4 assay.  Please interpret results in light of the total clinical presentation of the patient.    . Cholesterol 08/10/2019 189  0 - 200 mg/dL Final   ATP III Classification       Desirable:  < 200 mg/dL               Borderline High:  200 - 239 mg/dL          High:  > = 240 mg/dL  . Triglycerides 08/10/2019 46.0  0.0 - 149.0 mg/dL Final   Normal:  <150 mg/dLBorderline High:  150 - 199 mg/dL  . HDL 08/10/2019 66.60  >39.00 mg/dL Final  . VLDL 08/10/2019 9.2  0.0 - 40.0 mg/dL Final  . LDL Cholesterol 08/10/2019 113* 0 - 99 mg/dL Final  . Total CHOL/HDL Ratio 08/10/2019 3   Final                  Men          Women1/2 Average Risk     3.4          3.3Average Risk          5.0          4.42X Average Risk          9.6          7.13X Average Risk          15.0          11.0                      . NonHDL 08/10/2019 122.64   Final   NOTE:  Non-HDL goal should be 30 mg/dL higher than patient's LDL goal (i.e. LDL goal of < 70 mg/dL, would have non-HDL goal of < 100 mg/dL)  . Hemoglobin A1C 08/10/2019 6.7* 4.0 - 5.6 % Final   Her LDL-cholesterol is slightly higher, at 113, as expected. Her TSH is also slightly higher than normal, again, as expected, since she is taking levothyroxine at night.  I will recheck this at next visit, but would not suggest to change levothyroxine dose for now.  Philemon Kingdom, MD PhD [facility]

## 2019-08-11 ENCOUNTER — Telehealth: Payer: Self-pay

## 2019-08-11 NOTE — Telephone Encounter (Addendum)
-----   Message from Philemon Kingdom, MD sent at 08/10/2019  4:55 PM EDT -----  Lenna Sciara, can you please call pt: Her bad cholesterol (LDL) slightly higher, at 113, as expected. Her TSH is also slightly higher than normal, which again, we expected, since she is taking levothyroxine at night. I will recheck this at next visit, but would not suggest to change levothyroxine dose for now.

## 2019-08-12 NOTE — Telephone Encounter (Signed)
Notified patient of message from Dr. Gherghe, patient expressed understanding and agreement. No further questions.  

## 2019-08-22 ENCOUNTER — Other Ambulatory Visit: Payer: Self-pay | Admitting: Internal Medicine

## 2019-09-13 ENCOUNTER — Other Ambulatory Visit: Payer: Self-pay | Admitting: Internal Medicine

## 2019-09-16 ENCOUNTER — Other Ambulatory Visit: Payer: Self-pay | Admitting: Internal Medicine

## 2019-10-22 DIAGNOSIS — E103212 Type 1 diabetes mellitus with mild nonproliferative diabetic retinopathy with macular edema, left eye: Secondary | ICD-10-CM | POA: Diagnosis not present

## 2019-10-22 DIAGNOSIS — H26492 Other secondary cataract, left eye: Secondary | ICD-10-CM | POA: Diagnosis not present

## 2019-10-22 DIAGNOSIS — E0501 Thyrotoxicosis with diffuse goiter with thyrotoxic crisis or storm: Secondary | ICD-10-CM | POA: Diagnosis not present

## 2019-10-22 DIAGNOSIS — E0500 Thyrotoxicosis with diffuse goiter without thyrotoxic crisis or storm: Secondary | ICD-10-CM | POA: Diagnosis not present

## 2019-10-22 DIAGNOSIS — Z961 Presence of intraocular lens: Secondary | ICD-10-CM | POA: Diagnosis not present

## 2019-10-22 LAB — HM DIABETES EYE EXAM

## 2019-11-02 ENCOUNTER — Other Ambulatory Visit: Payer: Self-pay

## 2019-11-02 NOTE — Patient Outreach (Signed)
  [place] [facility]) Care Management Chronic Special Needs Program  11/02/2019  Name: Jody Cooke DOB: 1961-04-19  MRN: [id number]  Jody Cooke is enrolled in a chronic special needs plan for Diabetes. Client called with no answer No answer and HIPAA compliant message left. 1st attempt Plan for 2ndd outreach call in one week Chronic care management coordinator will attempt outreach in one week.   Peter Garter RN, Jackquline Denmark, CDE Chronic Care Management Coordinator [facility] [phone]

## 2019-11-03 ENCOUNTER — Other Ambulatory Visit: Payer: Self-pay

## 2019-11-03 NOTE — Patient Outreach (Signed)
[place] [facility]) Care Management Chronic Special Needs Program  11/03/2019  Name: Jody Cooke DOB: 11-28-1960  MRN: [id number]  Ms. Jody Cooke is enrolled in a chronic special needs plan for Diabetes. Chronic Care Management Coordinator telephoned client to review health risk assessment and to develop individualized care plan.  Reviewed the chronic care management program, importance of client participation, and taking their care plan to all provider appointments and inpatient facilities.  Reviewed the transition of care process and possible referral to community care management.  Subjective: Client states her blood sugars have been up and down for the last few days but it is starting to level out.  States she has been having fewer lows at night since she gets up to check and eat if needed.  States she is still thinking about getting a continuous glucose monitor but has not decided yet.  States she does a good job dosing her insulin before eating to match her CHO she ate.  States she tries to eat healthy and not over do it with her CHO.  States she is exercising at home for 20 minutes 5 times a week.  States she has the Advanced Directives forms but has not completed them yet.  States she has had her COVID shots.  Goals Addressed              This Visit's Progress   .   Acknowledge receipt of Advanced Directive package        Received forms Sent EMMI: Advanced Directives      .  Diabetes Patient stated goal to continue exercising 5 times a week for the next 12 months (pt-stated)        Reinforced importance of exercise on blood sugars and general health     .  HEMOGLOBIN A1C < 7         Last Hemoglobin A1C 6.7% on 08/10/19 Reviewed fasting blood sugar goals of 80-130 and less than 180 1 1/2-2 hours after meals Reinforced to follow a low carbohydrate low salt diet and to watch portion sizes Reviewed signs and symptoms of hypoglycemia and actions to take Please review  diabetes action plan in the [employer](HTA) calendar Encouraged to discuss with provider about getting a continuous glucose monitor    .  COMPLETED: Obtain annual  Lipid Profile, LDL-C   On track     Completed 08/10/19 LDL 113 The goal for LDL is less than 70 mg/dL as you are at high risk for complications Try to avoid saturated fats, trans-fats and eat more fiber Plan to take statin as ordered  Goal completed 11/03/19    .  COMPLETED: Obtain Annual Eye (retinal)  Exam    On track     Completed 10/22/19 Plan to have a dilated eye exam every year Goal completed 11/03/19    .  Obtain Annual Foot Exam   On track     Last completed 06/30/18 Check your skin and feet every day for cuts, bruises, redness, blisters, or sores. Report to provider any problems with your feet Schedule a foot exam with your health care provider once every year    .  Obtain annual screen for micro albuminuria (urine) , nephropathy (kidney problems)   On track     Completed 04/06/19 It is important for your doctor to check your urine for protein at least every year    .  Obtain Hemoglobin A1C at least 2 times per year   On  track     Completed 08/10/19 It is important to have your Hemoglobin A1C checked every 6 months if you are at goal and every 3 months if you are not at goal    .  Visit Primary Care Provider or Endocrinologist at least 2 times per year    On track     Endocrinologist visits 08/10/19 Please schedule your annual wellness visit       Plan:  Send successful outreach letter with a copy of their individualized care plan, Send individual care plan to provider and Send educational material-Advanced Directives   Chronic care management coordination will outreach in:  12 months per tier level or sooner is needed     Peter Garter RN, Jackquline Denmark, [facility] [phone]

## 2019-11-10 ENCOUNTER — Ambulatory Visit: Payer: Self-pay

## 2019-11-18 DIAGNOSIS — H26492 Other secondary cataract, left eye: Secondary | ICD-10-CM | POA: Diagnosis not present

## 2019-12-02 ENCOUNTER — Encounter: Payer: Self-pay | Admitting: Internal Medicine

## 2019-12-02 ENCOUNTER — Ambulatory Visit (INDEPENDENT_AMBULATORY_CARE_PROVIDER_SITE_OTHER): Payer: HMO | Admitting: Internal Medicine

## 2019-12-02 ENCOUNTER — Other Ambulatory Visit: Payer: Self-pay

## 2019-12-02 VITALS — BP 120/70 | HR 76 | Ht 69.0 in | Wt 144.0 lb

## 2019-12-02 DIAGNOSIS — E1042 Type 1 diabetes mellitus with diabetic polyneuropathy: Secondary | ICD-10-CM

## 2019-12-02 DIAGNOSIS — E785 Hyperlipidemia, unspecified: Secondary | ICD-10-CM | POA: Diagnosis not present

## 2019-12-02 DIAGNOSIS — E890 Postprocedural hypothyroidism: Secondary | ICD-10-CM

## 2019-12-02 LAB — T4, FREE: Free T4: 1.31 ng/dL (ref 0.60–1.60)

## 2019-12-02 LAB — VITAMIN B12: Vitamin B-12: 641 pg/mL (ref 211–911)

## 2019-12-02 LAB — POCT GLYCOSYLATED HEMOGLOBIN (HGB A1C): Hemoglobin A1C: 6.5 % — AB (ref 4.0–5.6)

## 2019-12-02 LAB — MAGNESIUM: Magnesium: 1.9 mg/dL (ref 1.5–2.5)

## 2019-12-02 LAB — TSH: TSH: 2.17 u[IU]/mL (ref 0.35–4.50)

## 2019-12-02 MED ORDER — TRESIBA FLEXTOUCH 100 UNIT/ML ~~LOC~~ SOPN: 19.0000 [IU] | PEN_INJECTOR | Freq: Every day | SUBCUTANEOUS | 3 refills | Status: DC

## 2019-12-02 MED ORDER — GLUCAGON 3 MG/DOSE NA POWD: 3.0000 mg | Freq: Once | NASAL | 11 refills | Status: AC | PRN

## 2019-12-02 NOTE — Addendum Note (Signed)
Addended by: Cardell Peach I on: 12/02/2019 11:18 AM   Modules accepted: Orders

## 2019-12-02 NOTE — Progress Notes (Addendum)
Patient ID: Jody Cooke, female   DOB: April 03, 1961, 59 y.o.   MRN: [id number]   This visit occurred during the SARS-CoV-2 public health emergency.  Safety protocols were in place, including screening questions prior to the visit, additional usage of staff PPE, and extensive cleaning of exam room while observing appropriate contact time as indicated for disinfecting solutions.   HPI: Jody Cooke is a 59 y.o.-year-old female, initially referred by her PCP, Dr. Ruben Gottron, for management of DM1, dx'ed at 59 y/o, uncontrolled, with long term complications (PN), also postablative hypothyroidism.  Last visit 4 months ago. She was previously seen by endocrinology at [facility], Dr. Meredith Pel in 2018, but she needed to change endocrinologists due to insurance restrictions.    She started to take a low dose B3 and mineral supplements >> feeling better, sugars more stable in the last week, she is also less thirsty.  DM1:  Reviewed HbA1c levels: Lab Results  Component Value Date   HGBA1C 6.7 (A) 08/10/2019   HGBA1C 6.4 (A) 04/06/2019   HGBA1C 6.6 (A) 12/03/2018  06/30/2018: HbA1c 7.1% 03/24/2018: HbA1c 6.9% 12/19/2017: Hb A1c 6.3% 09/16/2017: HbA1c 6.7%  She is on: - Levemir 16-16.5 >> 15 >> 14-18 >> 19.5 units at bedtime (vials) - NovoLog  0.5-5.5 >> 2.5-5.5 units 3x a day before meals Previously:   (she prefers her sugars to be at or around: 160)   Meter: One Touch Ultra.  She could not afford the CGM.  Also, she is not interested in one for now.  Pt checks her sugars 3-5 times a day per review of her detailed log: - am: 57-177, 180, 235 >> 57, 89-265, 285 >> 68-216, 243 - 2h after b'fast: 110 >> n/c - before lunch: 80-246 >> 83-217, 240 >> 68, 81-221, 259, 268 - 2h after lunch: n/c - before dinner: 63-204, 259 >> 133-253 >> 82-271 >> 50, 64, 73-210, 237, 301 - 2h after dinner: 69-222, 244 >> 44, 61-120 >> 120-278 >> 76, 123-250, 331 - bedtime: see above - nighttime: 105-246 >> 92-210 >> 57, 91-217, 328 >>  129-280 Lowest sugar was 24 >> .Marland KitchenMarland Kitchen40s (at night - but not recently - as she wakes up at night to correct CBGs) >> 50; she has hypoglycemia awareness at 60-100.  No history of hypoglycemia admissions.  She has a nonexpired glucagon kit at home. Highest sugar was 346 >> 342.  No previous DKA admissions.    Pt's meals are: - Breakfast: Cereals - Lunch: Meat and bread - Dinner: Meat, bread, veggies - Snacks: No  -No CKD, last BUN/creatinine:  Lab Results  Component Value Date   BUN 16 04/06/2019   BUN 14 08/13/2018   CREATININE 0.89 04/06/2019   CREATININE 0.84 08/13/2018   -+ HL; last set of lipids: Lab Results  Component Value Date   CHOL 189 08/10/2019   HDL 66.60 08/10/2019   LDLCALC 113 (H) 08/10/2019   TRIG 46.0 08/10/2019   CHOLHDL 3 08/10/2019  06/17/2018: 216/94/76/121 She refused statins due to negative publicity.  - last eye exam was in 2021: No DR; had one cataract surgery. She had a Laser tx >> helped a lot.  -+ Numbness and tingling in her feet.  Pt has no FH of DM.  Postablative hypothyroidism:  She was diagnosed with Graves' disease in 2008 and had RAI treatment then.  She has stable Graves' ophthalmopathy.  She had to have corrective eye surgery.  She is seeing Dr. Bing Plume..  No blurry vision.  Reviewed  her TFTs: Lab Results  Component Value Date   TSH 5.10 (H) 08/10/2019   TSH 2.27 04/06/2019   TSH 5.25 (H) 02/25/2019   TSH 7.92 (H) 01/05/2019   TSH 13.23 (H) 12/03/2018   TSH 3.02 08/13/2018  05/27/2018: TSH 8.65 03/24/2018: TSH 22.12  Pt is on levothyroxine 137 mcg daily, taken: - at bedtime -per her preference - fasting - at least 30 min from b'fast - + Ca in am, no Fe, MVI, PPIs  No FH of thyroid cancer. No h/o radiation tx to head or neck other than RAI treatment.  She takes herbal supplements (Spleen PMG, Simplex F, Cataplex F, Congaplex, etc).  No biotin.  No recent steroid use.  Pt denies: - feeling nodules in neck - hoarseness -  dysphagia - choking - SOB with lying down  He also has a history of chronic diarrhea, anal sphincter dysfunction, restless leg syndrome, vitamin D deficiency, dyslexia/ADD, osteopenia, IBS with diarrhea, vitiligo, OA, bursitis, tendinitis.  She also has Raynaud's disease- her 4th autoimmune ds.  At last visit she described pressure in her chest. She saw cardiology >> had a stress test and an event monitor without clear pathology found.  She is on vitamin D 4300 units daily. No results found for: VD25OH   Before last visit, she introduce more pork in her diet.  Previously, she was eating chicken and [place].  Her husband is a Biomedical scientist.  ROS: Constitutional: no weight gain/no weight loss, no fatigue, no subjective hyperthermia, no subjective hypothermia Eyes: no blurry vision, no xerophthalmia ENT: no sore throat, + see HPI Cardiovascular: no CP/no SOB/no palpitations/no leg swelling Respiratory: no cough/no SOB/no wheezing Gastrointestinal: no N/no V/no D/no C/no acid reflux Musculoskeletal: no muscle aches/no joint aches Skin: no rashes, no hair loss Neurological: no tremors/+ numbness/+ tingling/no dizziness  I reviewed pt's medications, allergies, PMH, social hx, family hx, and changes were documented in the history of present illness. Otherwise, unchanged from my initial visit note.  Past Medical History:  Diagnosis Date  . Chronic diarrhea   . Diabetes mellitus   . Grave's disease    Past Surgical History:  Procedure Laterality Date  . ABDOMINAL HYSTERECTOMY    . eye lid surgery     Social History   Socioeconomic History  . Marital status: Married    Spouse name: Not on file  . Number of children: Not on file  . Years of education: Not on file  . Highest education level: Not on file  Occupational History  .  Disabled  Social Needs  . Financial resource strain: Not on file  . Food insecurity:    Worry: Never true    Inability: Never true  . Transportation needs:     Medical: No    Non-medical: No  Tobacco Use  . Smoking status: Former Smoker    Packs/day: 1.00    Last attempt to quit: 1984    Years since quitting: 35.2  . Smokeless tobacco: Never Used  Substance and Sexual Activity  . Alcohol use: No  . Drug use: No  . Sexual activity: Not on file  Lifestyle  . Physical activity: Walking    Days per week:  Daily   Current Outpatient Medications on File Prior to Visit  Medication Sig Dispense Refill  . acetaminophen (TYLENOL) 650 MG CR tablet Take by mouth.    . BD INSULIN SYRINGE U/F 30G X 1/2" 0.5 ML MISC USE TO INJECT INSULIN UP TO 6 TIMES DAILY 200 each  3  . diphenoxylate-atropine (LOMOTIL) 2.5-0.025 MG per tablet Take 1 tablet by mouth 4 (four) times daily as needed. For diarrhea     . FISH OIL-VITAMIN D PO Take 1 tablet by mouth daily.    Marland Kitchen glucagon (GLUCAGON EMERGENCY) 1 MG injection Inject 1 mg into the muscle once as needed for up to 1 dose. 1 each 12  . insulin aspart (NOVOLOG) 100 UNIT/ML injection Inject 4.5-10 Units into the skin 3 (three) times daily before meals. (Patient taking differently: Inject 4.5-10 Units into the skin 3 (three) times daily before meals. 0-6 units per patient) 30 mL 3  . insulin detemir (LEVEMIR) 100 UNIT/ML injection Inject 0.16-0.18 mLs (16-18 Units total) into the skin at bedtime. 16.2 mL 1  . Lactobacillus Rhamnosus, GG, (CULTURELLE) CAPS Take by mouth.    . Lancets (ONETOUCH ULTRASOFT) lancets     . levothyroxine (SYNTHROID) 137 MCG tablet TAKE ONE TABLET BY MOUTH DAILY BEFORE BREAKFAST 90 tablet 1  . mometasone (ELOCON) 0.1 % cream Apply 1 application topically daily.      . Omega-3 1000 MG CAPS Take by mouth.    Glory Rosebush ULTRA test strip USE TO CHECK BLOOD SUGAR 6 TIMES DAILY 300 strip 10  . UNABLE TO FIND Congaplex    . UNABLE TO FIND Spleen PMG    . UNABLE TO FIND Med Name: F and F     No current facility-administered medications on file prior to visit.   Allergies  Allergen Reactions  .  Aspirin Anaphylaxis  . Milk-Related Compounds     *also allergic to soy milk  And rice milk   . Other   . Penicillins Other (See Comments)    Allergic since childhood  . Sulfa Antibiotics Diarrhea    Tree nuts  . Wheat Extract Diarrhea   Family History  Problem Relation Age of Onset  . Alzheimer's disease Mother   . Hypertension Mother   . Scoliosis Mother   . Diabetes Mother   . Hernia Father    PE: BP 120/70   Pulse 76   Ht _0  (1.753 m)   Wt 144 lb (65.3 kg)   LMP 12/10/2011   SpO2 97%   BMI 21.27 kg/m  Wt Readings from Last 3 Encounters:  12/02/19 144 lb (65.3 kg)  08/10/19 148 lb (67.1 kg)  04/06/19 152 lb (68.9 kg)   Constitutional: normal weight, in NAD Eyes: PERRLA, EOMI, + B symmetric exophthalmos, no stare ENT: moist mucous membranes, no thyromegaly, no cervical lymphadenopathy Cardiovascular: RRR, No MRG Respiratory: CTA B Gastrointestinal: abdomen soft, NT, ND, BS+ Musculoskeletal: no deformities, strength intact in all 4 Skin: moist, warm, no rashes Neurological: no tremor with outstretched hands, DTR normal in all 4 Diabetic Foot Exam - Simple   Simple Foot Form Diabetic Foot exam was performed with the following findings: Yes 12/02/2019 11:05 AM  Visual Inspection No deformities, no ulcerations, no other skin breakdown bilaterally: Yes Sensation Testing Intact to touch and monofilament testing bilaterally: Yes Pulse Check Posterior Tibialis and Dorsalis pulse intact bilaterally: Yes Comments     ASSESSMENT: 1. DM1, uncontrolled, with complications - PN  2. Postablative hypothyroidism  3. HL  PLAN:  1. Patient with longstanding, previously uncontrolled type 1 diabetes, now with better control, on a basal-bolus insulin regimen.  She is very insulin sensitive and we use lower doses of insulin for her.  She is keeping excellent records of her blood sugars, insulin doses administered, and also diet. -Her sugars  are usually fluctuating  significantly.  At last visit, sugars were more fluctuating than before and we could not make any changes in her regimen at that time.  She was doing an excellent job adjusting her doses of insulin based on her meals, her blood sugars before meals, and previous observed patterns.  At last visit we discussed that we have reached the limit of what we can do with her insulin regimen in the absence of an insulin pump.  I did strongly recommend a CGM to help her with blood sugar checks but also to give her alarms when sugars are starting to increase or decrease.  She wanted to think about it and let me know if she wanted to start.  At last visit she was waking up in the middle of the night and adjusting her blood sugars and she felt much better after she started to do so, with less palpitations and leg muscle cramps. -At this visit, sugars are slightly more stable in the last week after she started a supplement with B3 and minerals. Prior to this, per review of her detailed log, sugars were similar to before, fluctuating from 50-300s at almost all times of the day without a particular reason. We discussed about Tyler Aas as being a long-acting insulin that can induce less fluctuations in the blood sugars and I printed prescription for her to try. Otherwise, we will keep the NovoLog dose the same. -She would want me to check vitamin D and magnesium to make sure she is not deficient-we will check today -Due to her increased tingling and numbness in her feet (foot exam performed today and this was normal), will also check a B12 level -I did advise her to start a B complex but to stop this 1 week before our next visit and also alpha-lipoic acid to help with numbness and tingling. -I recommended:  Patient Instructions  Please continue: - Levemir (try to change to [place]) 19 units at bedtime - NovoLog 2.5-5.5 units 3x a day before meals.  Please continue Levothyroxine 137 mcg daily.  Take the thyroid hormone every  day, with water,  separated by at least 4 hours from: - acid reflux medications - calcium - iron - multivitamins  Can try the following combination for neuropathy: - alpha-lipoic acid 600 mg twice a day - B complex (stop this 1 week before thyroid tests)  Please come back for a follow-up appointment in 4 months.  - we checked her HbA1c: 6.5% (better) - advised to check sugars at different times of the day - 4x a day, rotating check times - advised for yearly eye exams >> she is UTD - return to clinic in 4 months  2. Postablative hypothyroidism -Developed hypothyroidism after RAI treatment in 2008 -She has stable Graves' ophthalmopathy - latest thyroid labs reviewed with pt >> TSH is slightly high: Lab Results  Component Value Date   TSH 5.10 (H) 08/10/2019   - she continues on LT4 137 mcg daily - pt feels good on this dose. - we discussed about taking the thyroid hormone every day, with water, >30 minutes before breakfast, separated by >4 hours from acid reflux medications, calcium, iron, multivitamins.  However, patient is taking levothyroxine around 11 PM, approximately 4 hours after dinner as she cannot delay her morning supplements until midday.  We discussed in the past about the fact that this introduces more variability in her TFTs but she preferred to continue taking it at night. - will check thyroid tests  today: TSH and fT4 >> If TSH is still elevated, will increase the dose of LT4 to 150 mcg daily - If labs are abnormal, she will need to return for repeat TFTs in 1.5 months  3. HL -Reviewed her latest lipid panel from 07/2019: LDL above target, the rest of the fractions were at goal Lab Results  Component Value Date   CHOL 189 08/10/2019   HDL 66.60 08/10/2019   LDLCALC 113 (H) 08/10/2019   TRIG 46.0 08/10/2019   CHOLHDL 3 08/10/2019  -She refused statins in the past  Component     Latest Ref Rng & Units 12/02/2019  TSH     0.35 - 4.50 uIU/mL 2.17   T4,Free(Direct)     0.60 - 1.60 ng/dL 1.31  Magnesium     1.5 - 2.5 mg/dL 1.9  Vitamin D, 25-Hydroxy     30.0 - 100.0 ng/mL 51.8  Vitamin B12     211 - 911 pg/mL 641  All labs are normal.  Philemon Kingdom, MD PhD [facility] Endocrinology

## 2019-12-02 NOTE — Patient Instructions (Addendum)
Please continue: - Levemir (try to change to [place]) 19 units at bedtime - NovoLog 2.5-5.5 units 3x a day before meals.  Please continue Levothyroxine 137 mcg daily.  Take the thyroid hormone every day, with water,  separated by at least 4 hours from: - acid reflux medications - calcium - iron - multivitamins  Can try the following combination for neuropathy: - alpha-lipoic acid 600 mg twice a day - B complex (stop this 1 week before thyroid tests)  Please come back for a follow-up appointment in 4 months.

## 2019-12-03 LAB — VITAMIN D 25 HYDROXY (VIT D DEFICIENCY, FRACTURES): Vit D, 25-Hydroxy: 51.8 ng/mL (ref 30.0–100.0)

## 2019-12-06 ENCOUNTER — Telehealth: Payer: Self-pay | Admitting: Internal Medicine

## 2019-12-06 NOTE — Telephone Encounter (Signed)
Patient requests to be called at ph# [phone] to be given her lab results and to discuss if dosage of Levothyroxine should be changed.

## 2019-12-07 NOTE — Telephone Encounter (Signed)
Gave patient lab results and she verbalized an understanding

## 2019-12-25 ENCOUNTER — Other Ambulatory Visit: Payer: Self-pay | Admitting: Internal Medicine

## 2020-01-04 ENCOUNTER — Ambulatory Visit: Payer: HMO | Admitting: Internal Medicine

## 2020-01-12 DIAGNOSIS — K529 Noninfective gastroenteritis and colitis, unspecified: Secondary | ICD-10-CM | POA: Diagnosis not present

## 2020-01-12 DIAGNOSIS — K6289 Other specified diseases of anus and rectum: Secondary | ICD-10-CM | POA: Diagnosis not present

## 2020-01-18 ENCOUNTER — Telehealth: Payer: Self-pay | Admitting: Family Medicine

## 2020-01-18 MED ORDER — ONETOUCH ULTRA VI STRP: ORAL_STRIP | 10 refills | Status: DC

## 2020-01-18 NOTE — Telephone Encounter (Signed)
Pt needs refill on One Touch Ultra Strips sent to [facility]. She said instead of the 200 she needs 250. She said she is almost out and needs this sent in as soon as possible.

## 2020-01-18 NOTE — Addendum Note (Signed)
Addended by: Cardell Peach I on: 01/18/2020 11:49 AM   Modules accepted: Orders

## 2020-02-01 ENCOUNTER — Other Ambulatory Visit: Payer: Self-pay | Admitting: Internal Medicine

## 2020-02-24 ENCOUNTER — Telehealth: Payer: Self-pay | Admitting: Internal Medicine

## 2020-02-24 ENCOUNTER — Other Ambulatory Visit: Payer: Self-pay

## 2020-02-24 NOTE — Telephone Encounter (Signed)
Patient requests to be called at ph# [phone] to be given her lab results from 12/02/19 as well as exactly what labs were ordered/done on 12/02/19.

## 2020-02-25 NOTE — Telephone Encounter (Signed)
Spoke with patient and advise her on the labs she had done and what the ranges are.

## 2020-02-29 DIAGNOSIS — Z23 Encounter for immunization: Secondary | ICD-10-CM | POA: Diagnosis not present

## 2020-03-01 ENCOUNTER — Telehealth: Payer: Self-pay | Admitting: Internal Medicine

## 2020-03-01 NOTE — Telephone Encounter (Signed)
Patient called to advise that her One Touch Test Strip prescription needs to be written so that she can get enough test strips to test up to 8x per day.  Patient is currently not able to refill her current RX and only has one box left of test strips.  [facility] [address] # [phone]

## 2020-03-02 ENCOUNTER — Other Ambulatory Visit: Payer: Self-pay

## 2020-03-02 MED ORDER — ONETOUCH ULTRA VI STRP
ORAL_STRIP | 10 refills | Status: DC
Start: 2020-03-02 — End: 2021-04-02

## 2020-03-02 NOTE — Telephone Encounter (Signed)
RX sent to pharmacy  

## 2020-03-02 NOTE — Telephone Encounter (Signed)
Current RX says 4 times daily, increase to 8 times daily??  Thanks!

## 2020-03-02 NOTE — Telephone Encounter (Signed)
Yes, no pb! 

## 2020-03-08 DIAGNOSIS — Z1231 Encounter for screening mammogram for malignant neoplasm of breast: Secondary | ICD-10-CM | POA: Diagnosis not present

## 2020-03-13 ENCOUNTER — Other Ambulatory Visit: Payer: Self-pay | Admitting: Internal Medicine

## 2020-03-15 DIAGNOSIS — Z794 Long term (current) use of insulin: Secondary | ICD-10-CM | POA: Diagnosis not present

## 2020-03-15 DIAGNOSIS — H04123 Dry eye syndrome of bilateral lacrimal glands: Secondary | ICD-10-CM | POA: Diagnosis not present

## 2020-03-15 DIAGNOSIS — E109 Type 1 diabetes mellitus without complications: Secondary | ICD-10-CM | POA: Diagnosis not present

## 2020-03-15 DIAGNOSIS — H2511 Age-related nuclear cataract, right eye: Secondary | ICD-10-CM | POA: Diagnosis not present

## 2020-03-20 ENCOUNTER — Other Ambulatory Visit: Payer: Self-pay

## 2020-03-20 NOTE — Patient Outreach (Signed)
  [facility] Care Management Chronic Special Needs Program    03/20/2020  Name: AMYRIAH BURAS, DOB: December 08, 1960  MRN: [id number]   Ms. Ayah Cozzolino is enrolled in a chronic special needs plan for Diabetes.  [facility] Management will continue to provide services for this client through 05/12/2020. [facility] care management team will assume care 05/13/2020.  Peter Garter RN, Jackquline Denmark, CDE Chronic Care Management Coordinator [facility] Management [phone]

## 2020-04-04 ENCOUNTER — Encounter: Payer: Self-pay | Admitting: Internal Medicine

## 2020-04-04 ENCOUNTER — Ambulatory Visit (INDEPENDENT_AMBULATORY_CARE_PROVIDER_SITE_OTHER): Payer: HMO | Admitting: Internal Medicine

## 2020-04-04 ENCOUNTER — Other Ambulatory Visit: Payer: Self-pay

## 2020-04-04 VITALS — BP 130/82 | HR 79 | Ht 69.0 in | Wt 149.6 lb

## 2020-04-04 DIAGNOSIS — E890 Postprocedural hypothyroidism: Secondary | ICD-10-CM

## 2020-04-04 DIAGNOSIS — E1042 Type 1 diabetes mellitus with diabetic polyneuropathy: Secondary | ICD-10-CM | POA: Diagnosis not present

## 2020-04-04 DIAGNOSIS — E785 Hyperlipidemia, unspecified: Secondary | ICD-10-CM

## 2020-04-04 LAB — POCT GLYCOSYLATED HEMOGLOBIN (HGB A1C): Hemoglobin A1C: 7.1 % — AB (ref 4.0–5.6)

## 2020-04-04 NOTE — Patient Instructions (Signed)
Please continue: - Levemir 18.5 units at bedtime - NovoLog 2.5-5.5 units 3x a day before meals.  Please continue Levothyroxine 137 mcg daily.  Take the thyroid hormone every day, with water,  separated by at least 4 hours from: - acid reflux medications - calcium - iron - multivitamins  Please come back for a follow-up appointment in 4 months.

## 2020-04-04 NOTE — Progress Notes (Signed)
Patient ID: Jody Cooke, female   DOB: 10/30/1960, 59 y.o.   MRN: [id number]   This visit occurred during the SARS-CoV-2 public health emergency.  Safety protocols were in place, including screening questions prior to the visit, additional usage of staff PPE, and extensive cleaning of exam room while observing appropriate contact time as indicated for disinfecting solutions.   HPI: Jody Cooke is a 59 y.o.-year-old female, initially referred by her PCP, Dr. Ruben Gottron, for management of DM1, dx'ed at 59 y/o, uncontrolled, with long term complications (PN), also postablative hypothyroidism.  Last visit 4 months ago. She was previously seen by endocrinology at [facility], Dr. Meredith Pel in 2018, but she needed to change endocrinologists due to insurance restrictions.    Before last visit, she started to take a low dose B3 and mineral supplements >> feeling better, was less thirsty and sugars were more stable.  Since last visit, sugars are slightly higher, but she feels that she has less lows.  DM1:  Reviewed HbA1c levels: Lab Results  Component Value Date   HGBA1C 6.5 (A) 12/02/2019   HGBA1C 6.7 (A) 08/10/2019   HGBA1C 6.4 (A) 04/06/2019  06/30/2018: HbA1c 7.1% 03/24/2018: HbA1c 6.9% 12/19/2017: Hb A1c 6.3% 09/16/2017: HbA1c 6.7%  She is on: - Levemir 16-16.5 >> 15 >> 14-18 >> 19.5 units at bedtime (vials) >> did not change to [place] due to previous many allergies >> 18.5 units at bedtime - NovoLog  0.5-5.5 >> 2.5-5.5 units 3x a day before meals Previously:   (she prefers her sugars to be at or around: 160)   Meter: One Touch Ultra.  She could not afford a CGM and also, she is not interested in getting one.  Pt checks her sugars 3-5 times a day per review of her detailed log: - am: 57-177, 180, 235 >> 57, 89-265, 285 >> 68-216, 243 >> 68, 80-270 - 2h after b'fast: 110 >> n/c >> 64, 430 - before lunch: 80-246 >> 83-217, 240 >> 68, 81-221, 259, 268 >> 54, 78-254, 260 - 2h after lunch: n/c - before dinner:  133-253 >> 82-271 >> 50, 64, 73-210, 237, 301 >> 42, 101-371 - 2h after dinner: 44, 61-120 >> 120-278 >> 76, 123-250, 331 >> 70, 91-338 - bedtime: see above - nighttime: 105-246 >> 92-210 >> 57, 91-217, 328 >> 129-280 >> 108-316 Lowest sugar was 24 >> .Marland Kitchen.40s >> 50 >> 42 before dinner; she has hypoglycemia awareness at 60-100.  No history of hypoglycemia admissions.  She has an unexpired glucagon kit at home. Highest sugar was 346 >> 342 >> 430.  No previous DKA admissions.    Pt's meals are: - Breakfast: Cereals - Lunch: Meat and bread - Dinner: Meat, bread, veggies - Snacks: No  -No CKD, last BUN/creatinine:  Lab Results  Component Value Date   BUN 16 04/06/2019   BUN 14 08/13/2018   CREATININE 0.89 04/06/2019   CREATININE 0.84 08/13/2018   -+ HL; last set of lipids: Lab Results  Component Value Date   CHOL 189 08/10/2019   HDL 66.60 08/10/2019   LDLCALC 113 (H) 08/10/2019   TRIG 46.0 08/10/2019   CHOLHDL 3 08/10/2019  06/17/2018: 216/94/76/121 She refused statins in the past due to negative obesity.  - last eye exam was on 03/15/2020; had one cataract surgery. She had a Laser tx >> helped a lot.  -She has numbness and tingling in her feet.  At last visit (12/02/2019) after the exam was checked and this was normal.  I  recommended alpha-lipoic acid and B complex to help with her symptoms.  Pt has no FH of DM.  Postablative hypothyroidism:  She was diagnosed with Graves' disease in 2008.  She had RAI treatment then.  She has stable Graves' ophthalmopathy.  She had to have corrective eye surgery.  She was seen Dr. Bing Plume but changed after since then.  No blurry vision.  Reviewed her TFTs: Lab Results  Component Value Date   TSH 2.17 12/02/2019   TSH 5.10 (H) 08/10/2019   TSH 2.27 04/06/2019   TSH 5.25 (H) 02/25/2019   TSH 7.92 (H) 01/05/2019   TSH 13.23 (H) 12/03/2018   TSH 3.02 08/13/2018  05/27/2018: TSH 8.65 03/24/2018: TSH 22.12  Pt is on levothyroxine 137 mcg  daily, taken: - in am - fasting - at least 4h after dinner (per her preference) - + calcium in a.m. - no iron - no multivitamins - no PPIs - not on Biotin  No FH of thyroid cancer. No h/o radiation tx to head or neck other than RAI treatment.  No seaweed or kelp. No recent contrast studies. No Biotin use. No recent steroids use.   She takes herbal supplements (Spleen PMG, Simplex F, Cataplex F, Congaplex, etc).    Pt denies: - feeling nodules in neck - hoarseness - dysphagia - choking - SOB with lying down  He also has a history of chronic diarrhea, anal sphincter dysfunction, restless leg syndrome, dyslexia/ADD, osteopenia, IBS with diarrhea, vitiligo, OA, bursitis, tendinitis.  She also has Raynaud's disease- her 4th autoimmune ds.  At last visit she described pressure in her chest. She saw cardiology >> had a stress test and an event monitor >> no clear pathology found  She has a history of vitamin D deficiency.  She is on vitamin D 4300 units daily.  Latest vitamin D level was excellent: Lab Results  Component Value Date   VD25OH 51.8 12/02/2019    Her husband is a Biomedical scientist.  ROS: Constitutional: no weight gain/no weight loss, no fatigue, no subjective hyperthermia, no subjective hypothermia Eyes: no blurry vision, no xerophthalmia ENT: no sore throat, + see HPI Cardiovascular: no CP/no SOB/no palpitations/no leg swelling Respiratory: no cough/no SOB/no wheezing Gastrointestinal: no N/no V/no D/no C/no acid reflux Musculoskeletal: no muscle aches/no joint aches Skin: no rashes, no hair loss Neurological: no tremors/+ numbness/+ tingling/no dizziness  I reviewed pt's medications, allergies, PMH, social hx, family hx, and changes were documented in the history of present illness. Otherwise, unchanged from my initial visit note.  Past Medical History:  Diagnosis Date  . Chronic diarrhea   . Diabetes mellitus   . Grave's disease    Past Surgical History:  Procedure  Laterality Date  . ABDOMINAL HYSTERECTOMY    . eye lid surgery     Social History   Socioeconomic History  . Marital status: Married    Spouse name: Not on file  . Number of children: Not on file  . Years of education: Not on file  . Highest education level: Not on file  Occupational History  .  Disabled  Social Needs  . Financial resource strain: Not on file  . Food insecurity:    Worry: Never true    Inability: Never true  . Transportation needs:    Medical: No    Non-medical: No  Tobacco Use  . Smoking status: Former Smoker    Packs/day: 1.00    Last attempt to quit: 1984    Years since quitting: 35.2  .  Smokeless tobacco: Never Used  Substance and Sexual Activity  . Alcohol use: No  . Drug use: No  . Sexual activity: Not on file  Lifestyle  . Physical activity: Walking    Days per week:  Daily   Current Outpatient Medications on File Prior to Visit  Medication Sig Dispense Refill  . acetaminophen (TYLENOL) 650 MG CR tablet Take by mouth.    . BD INSULIN SYRINGE U/F 30G X 1/2" 0.5 ML MISC USE TO INJECT INSULIN UP TO 6 TIMES DAILY 200 each 3  . Glucagon 3 MG/DOSE POWD Place 3 mg into the nose once as needed for up to 1 dose. 1 each 11  . glucose blood (ONETOUCH ULTRA) test strip USE TO CHECK BLOOD SUGAR 8 TIMES DAILY Dx: E10.9 800 each 10  . insulin aspart (NOVOLOG) 100 UNIT/ML injection INJECT 4.5-10 UNITS INTO THE SKIN 3 TIMES DAILY BEFORE MEALS 10 mL 0  . insulin degludec (TRESIBA FLEXTOUCH) 100 UNIT/ML FlexTouch Pen Inject 0.19 mLs (19 Units total) into the skin daily. 5 pen 3  . Lancets (ONETOUCH ULTRASOFT) lancets     . LEVEMIR 100 UNIT/ML injection INJECT 0.16 TO 0.18 MLS (16-18 UNITS) INTO THE SKIN AT BEDTIME 10 mL 4  . levothyroxine (SYNTHROID) 137 MCG tablet TAKE 1 TABLET BY MOUTH DAILY BEFORE BREAKFAST 90 tablet 1  . mometasone (ELOCON) 0.1 % cream Apply 1 application topically daily.      Marland Kitchen UNABLE TO FIND Congaplex    . UNABLE TO FIND Spleen PMG    .  UNABLE TO FIND Med Name: Cataplex F and [place] Name: [place] D     No current facility-administered medications on file prior to visit.   Allergies  Allergen Reactions  . Aspirin Anaphylaxis  . Milk-Related Compounds     *also allergic to soy milk  And rice milk   . Other   . Penicillins Rash    Allergic since childhood  . Sulfa Antibiotics Diarrhea    Tree nuts   Family History  Problem Relation Age of Onset  . Alzheimer's disease Mother   . Hypertension Mother   . Scoliosis Mother   . Diabetes Mother   . Hernia Father    PE: BP 130/82   Pulse 79   Ht '5\' 9"'  (1.753 m)   Wt 149 lb 9.6 oz (67.9 kg)   LMP 12/10/2011   SpO2 97%   BMI 22.09 kg/m  Wt Readings from Last 3 Encounters:  04/04/20 149 lb 9.6 oz (67.9 kg)  12/02/19 144 lb (65.3 kg)  08/10/19 148 lb (67.1 kg)   Constitutional: normal weight, in NAD Eyes: PERRLA, EOMI, + bilateral symmetric exophthalmos ENT: moist mucous membranes, no thyromegaly, no cervical lymphadenopathy Cardiovascular: RRR, No MRG Respiratory: CTA B Gastrointestinal: abdomen soft, NT, ND, BS+ Musculoskeletal: no deformities, strength intact in all 4 Skin: moist, warm, no rashes Neurological: no tremor with outstretched hands, DTR normal in all 4  ASSESSMENT: 1. DM1, uncontrolled, with complications - PN  2. Postablative hypothyroidism  3. HL  PLAN:  1. Patient with longstanding, previously uncontrolled type 1 diabetes, now with better control on 8 basal-bolus insulin regimen.  She is very insulin sensitive and we use low doses of insulin for her.  She is keeping excellent records of her blood sugars, insulin doses, and also diet.  Her sugars are usually very fluctuating but she does an excellent job adjusting her doses of insulin based on  her meals, her blood sugars before meals, and previous observed patterns.  We did discuss at last visits about the fact that we reached the limit of what we can do with her  insulin regimen in the absence of an insulin pump.  I did strongly recommend the CGM to help her with blood sugar control including alarms but she decided not to try warm.  At last visit, sugars were slightly more stable in the week prior to the visit after she started the supplement with B3 and minerals.  We did discuss about using Tresiba instead of her Levemir to reduce her fluctuations in the blood sugars and I printed a prescription for her to try.  Otherwise, I did not suggest a change in NovoLog doses. -At last visit, she wanted me to check her magnesium, vitamin D-we did check these and they were all normal.  At that time, she had tingling and numbness in her feet.  We also checked a B12 level which was normal.  However, I did recommend alpha-lipoic acid and B complex to help with her symptoms. -At today's visit, sugars appear to be slightly higher than before but also with less lows.  She has an occasional blood sugar in the 50s or 60s and 1 at 42 (before dinner) in the last months, per review of her very detailed log.  She is adjusting the dose of insulin based on patterns, or other day insulin to carb ratios.  She feels that this is working better for her. -As mentioned above, at last visit I suggested to switch to [place] but she did not try it as she is very reticent to change anything in her regimen due to previous allergies. -Unfortunately, her sugars remain very variable, without any trends.  It is almost impossible for me to make any changes in her regimen.  I did suggest an [place] at this visit and discussed the benefits.  Given a brochure.  She is reticent to also try this, but I advised her to take a look at the brochure and let me know if she wants to try it. -I recommended:  Patient Instructions  Please continue: - Levemir 18.5 units at bedtime - NovoLog 2.5-5.5 units 3x a day before meals.  Please continue Levothyroxine 137 mcg daily.  Take the thyroid hormone every day, with  water,  separated by at least 4 hours from: - acid reflux medications - calcium - iron - multivitamins  Please come back for a follow-up appointment in 4 months.  - we checked her HbA1c: 7.1% (higher) - advised to check sugars at different times of the day - 4x a day, rotating check times - advised for yearly eye exams >> she is UTD - return to clinic in 4 months  2. Postablative hypothyroidism -She developed hypothyroidism after RAI treatment in 2008 for Graves' disease -She has stable Graves' ophthalmopathy- saw ophthalmologist on 03/15/2020 (second opinion).  At that time, it was mentioned that she had stable Graves' ophthalmopathy, dry eyes, cataracts. - latest thyroid labs reviewed with pt >> normal: Lab Results  Component Value Date   TSH 2.17 12/02/2019   - she continues on LT4 137 mcg daily - pt feels good on this dose. - we discussed about taking the thyroid hormone every day, with water, >30 minutes before breakfast, separated by >4 hours from acid reflux medications, calcium, iron, multivitamins.  However, she is taking it around 11 PM, approximately 4 hours after dinner and she cannot delay her morning  supplements until midday.  We discussed in the past about the fact that this induces more variability in her TFTs, but she prefers to continue taking it at night. - will recheck at next OV  3. HL -Reviewed her lipid panel from 07/2019: LDL above target, the rest of the fractions at goal: Lab Results  Component Value Date   CHOL 189 08/10/2019   HDL 66.60 08/10/2019   LDLCALC 113 (H) 08/10/2019   TRIG 46.0 08/10/2019   CHOLHDL 3 08/10/2019  -She refused statins in the past - will recheck at next OV   Philemon Kingdom, MD PhD [facility] Endocrinology

## 2020-04-04 NOTE — Addendum Note (Signed)
Addended by: Caprice Beaver T on: 04/04/2020 01:40 PM   Modules accepted: Orders

## 2020-04-17 ENCOUNTER — Telehealth: Payer: Self-pay | Admitting: Internal Medicine

## 2020-04-17 DIAGNOSIS — E1042 Type 1 diabetes mellitus with diabetic polyneuropathy: Secondary | ICD-10-CM

## 2020-04-17 MED ORDER — NOVOLOG 100 UNIT/ML ~~LOC~~ SOLN: SUBCUTANEOUS | 0 refills | Status: DC

## 2020-04-17 NOTE — Telephone Encounter (Signed)
Patient requests to be called asap at ph# [phone] re: Patient is experiencing reaction to insulin aspart (NOVOLOG) 100 UNIT/ML injection such as swelling, anxiety, etc-Patient would like to know if she needs to be seen by Urgent Care or ER.  Patient requests a new RX for Novolog (not aspart) be sent to  [facility], [address] Phone:  [phone]  Fax:  [phone]

## 2020-04-17 NOTE — Telephone Encounter (Signed)
Patient called regarding messages below, patient states the prescription needs to have novolog D.A.W 1. I made patient aware of the last message from the nurse. Patient states she is at the pharmacy now and needs this done urgently.

## 2020-04-17 NOTE — Telephone Encounter (Signed)
T, I did not change the prescription.  We did not refill the prescription at last visit.  Therefore, it appears that the pharmacy probably gave her the generic Aspart instead of NovoLog.  Usually there should not be any problem with the substitution.  However, we can go ahead and send to the pharmacy prescription for NovoLog d.a.w. Regarding the hand swelling, if she does not have trouble breathing she can continue to observe the symptoms at home.  If not improving, she can either go to urgent care or check in with the PCP.

## 2020-04-17 NOTE — Telephone Encounter (Signed)
Rx sent to patient's preferred pharmacy.

## 2020-04-17 NOTE — Telephone Encounter (Signed)
Called patient and confirmed she took her evening does of Levemir around 11pm and went to bed. Around 4 am she woke up with swollen hands, sleeplessness and anxiety. Patient believes this is due to new prescription of Aspart she has been prescribed and started taking. Patient took her morning dose of Aspart and hand swelling has decreased but would like her Novolog (non aspart) sent to her preferred pharmacy. Please advise.

## 2020-04-17 NOTE — Telephone Encounter (Signed)
Called and advised patient new Rx will be sent to pharmacy for [place].W. Patient advised to monitor swelling and if any problems with breathing to either go to an urgent care or check in with PCP. Patient verbalized understanding.

## 2020-04-20 DIAGNOSIS — Z01419 Encounter for gynecological examination (general) (routine) without abnormal findings: Secondary | ICD-10-CM | POA: Diagnosis not present

## 2020-05-03 DIAGNOSIS — L821 Other seborrheic keratosis: Secondary | ICD-10-CM | POA: Diagnosis not present

## 2020-05-03 DIAGNOSIS — L820 Inflamed seborrheic keratosis: Secondary | ICD-10-CM | POA: Diagnosis not present

## 2020-05-16 ENCOUNTER — Other Ambulatory Visit: Payer: Self-pay

## 2020-05-26 ENCOUNTER — Other Ambulatory Visit: Payer: Self-pay | Admitting: Internal Medicine

## 2020-05-30 ENCOUNTER — Telehealth: Payer: Self-pay | Admitting: Internal Medicine

## 2020-05-30 DIAGNOSIS — E1042 Type 1 diabetes mellitus with diabetic polyneuropathy: Secondary | ICD-10-CM

## 2020-05-30 MED ORDER — NOVOLOG 100 UNIT/ML ~~LOC~~ SOLN: SUBCUTANEOUS | 0 refills | Status: DC

## 2020-05-30 NOTE — Telephone Encounter (Signed)
Rx sent to preferred pharmacy marked DAW.

## 2020-05-30 NOTE — Telephone Encounter (Signed)
T, I think she is referring to [facility]. Let's send NovoLog mentioning DAW-1

## 2020-05-30 NOTE — Telephone Encounter (Signed)
Pt called needing a refill on her Novolog. Pt stated Dr. Cruzita Lederer has a special code she needs to write on the prescription.  Pt also stated she cannot take any medication with "aspart" due to it causing swelling of her hands along with other side effects.  PHARMACY:  [facility], [address] Phone:  [phone]  Fax:  [phone]

## 2020-05-30 NOTE — Telephone Encounter (Signed)
A code?

## 2020-06-07 DIAGNOSIS — H16223 Keratoconjunctivitis sicca, not specified as Sjogren's, bilateral: Secondary | ICD-10-CM | POA: Diagnosis not present

## 2020-06-07 DIAGNOSIS — Z961 Presence of intraocular lens: Secondary | ICD-10-CM | POA: Diagnosis not present

## 2020-06-07 DIAGNOSIS — H2511 Age-related nuclear cataract, right eye: Secondary | ICD-10-CM | POA: Diagnosis not present

## 2020-06-07 DIAGNOSIS — H25041 Posterior subcapsular polar age-related cataract, right eye: Secondary | ICD-10-CM | POA: Diagnosis not present

## 2020-06-07 DIAGNOSIS — H02413 Mechanical ptosis of bilateral eyelids: Secondary | ICD-10-CM | POA: Diagnosis not present

## 2020-06-17 ENCOUNTER — Other Ambulatory Visit: Payer: Self-pay

## 2020-06-17 ENCOUNTER — Encounter (HOSPITAL_BASED_OUTPATIENT_CLINIC_OR_DEPARTMENT_OTHER): Payer: Self-pay | Admitting: Emergency Medicine

## 2020-06-17 ENCOUNTER — Emergency Department (HOSPITAL_BASED_OUTPATIENT_CLINIC_OR_DEPARTMENT_OTHER)
Admission: EM | Admit: 2020-06-17 | Discharge: 2020-06-18 | Disposition: A | Discharge status: Home / Self Care | Payer: HMO | Attending: Emergency Medicine | Admitting: Emergency Medicine

## 2020-06-17 ENCOUNTER — Emergency Department (HOSPITAL_BASED_OUTPATIENT_CLINIC_OR_DEPARTMENT_OTHER): Payer: HMO

## 2020-06-17 DIAGNOSIS — E109 Type 1 diabetes mellitus without complications: Secondary | ICD-10-CM | POA: Diagnosis not present

## 2020-06-17 DIAGNOSIS — E039 Hypothyroidism, unspecified: Secondary | ICD-10-CM | POA: Diagnosis not present

## 2020-06-17 DIAGNOSIS — H81399 Other peripheral vertigo, unspecified ear: Secondary | ICD-10-CM

## 2020-06-17 DIAGNOSIS — Z79899 Other long term (current) drug therapy: Secondary | ICD-10-CM | POA: Insufficient documentation

## 2020-06-17 DIAGNOSIS — R42 Dizziness and giddiness: Secondary | ICD-10-CM | POA: Insufficient documentation

## 2020-06-17 DIAGNOSIS — Z87891 Personal history of nicotine dependence: Secondary | ICD-10-CM | POA: Diagnosis not present

## 2020-06-17 LAB — CBG MONITORING, ED: Glucose-Capillary: 187 mg/dL — ABNORMAL HIGH (ref 70–99)

## 2020-06-17 LAB — COMPREHENSIVE METABOLIC PANEL
ALT: 19 U/L (ref 0–44)
AST: 22 U/L (ref 15–41)
Albumin: 4.3 g/dL (ref 3.5–5.0)
Alkaline Phosphatase: 48 U/L (ref 38–126)
Anion gap: 10 (ref 5–15)
BUN: 19 mg/dL (ref 8–23)
CO2: 27 mmol/L (ref 22–32)
Calcium: 9.1 mg/dL (ref 8.9–10.3)
Chloride: 97 mmol/L — ABNORMAL LOW (ref 98–111)
Creatinine, Ser: 0.9 mg/dL (ref 0.44–1.00)
GFR, Estimated: >60 mL/min (ref >60)
Glucose, Bld: 211 mg/dL — ABNORMAL HIGH (ref 70–99)
Potassium: 3.3 mmol/L — ABNORMAL LOW (ref 3.5–5.1)
Sodium: 134 mmol/L — ABNORMAL LOW (ref 135–145)
Total Bilirubin: 0.6 mg/dL (ref 0.3–1.2)
Total Protein: 7.5 g/dL (ref 6.5–8.1)

## 2020-06-17 LAB — CBC
HCT: 34.7 % — ABNORMAL LOW (ref 36.0–46.0)
Hemoglobin: 11.8 g/dL — ABNORMAL LOW (ref 12.0–15.0)
MCH: 33.5 pg (ref 26.0–34.0)
MCHC: 34 g/dL (ref 30.0–36.0)
MCV: 98.6 fL (ref 80.0–100.0)
Platelets: 237 K/uL (ref 150–400)
RBC: 3.52 MIL/uL — ABNORMAL LOW (ref 3.87–5.11)
RDW: 12.2 % (ref 11.5–15.5)
WBC: 8 K/uL (ref 4.0–10.5)
nRBC: 0 % (ref 0.0–0.2)

## 2020-06-17 LAB — LIPASE, BLOOD: Lipase: 57 U/L — ABNORMAL HIGH (ref 11–51)

## 2020-06-17 MED ORDER — MECLIZINE HCL 25 MG PO TABS
25.0000 mg | ORAL_TABLET | Freq: Once | ORAL | Status: AC
  Administered 2020-06-17: 25 mg via ORAL
  Filled 2020-06-17: qty 1

## 2020-06-17 MED ORDER — SODIUM CHLORIDE 0.9 % IV BOLUS
500.0000 mL | Freq: Once | INTRAVENOUS | Status: AC
  Administered 2020-06-17: 500 mL via INTRAVENOUS

## 2020-06-17 MED ORDER — ONDANSETRON HCL 4 MG/2ML IJ SOLN
4.0000 mg | Freq: Once | INTRAMUSCULAR | Status: AC | PRN
  Administered 2020-06-17: 4 mg via INTRAVENOUS
  Filled 2020-06-17: qty 2

## 2020-06-17 MED ORDER — MECLIZINE HCL 25 MG PO TABS
ORAL_TABLET | ORAL | Status: AC
  Filled 2020-06-17: qty 1

## 2020-06-17 NOTE — ED Triage Notes (Signed)
Reports dizziness that started around 6pm.  Describes the room as spinning.  Also endorses n/v/d.  Reports she has diarrhea all the time.

## 2020-06-18 ENCOUNTER — Encounter (HOSPITAL_BASED_OUTPATIENT_CLINIC_OR_DEPARTMENT_OTHER): Payer: Self-pay | Admitting: Emergency Medicine

## 2020-06-18 LAB — CBG MONITORING, ED: Glucose-Capillary: 317 mg/dL — ABNORMAL HIGH (ref 70–99)

## 2020-06-18 MED ORDER — MECLIZINE HCL 12.5 MG PO TABS: 12.5000 mg | ORAL_TABLET | Freq: Three times a day (TID) | ORAL | 0 refills | Status: DC | PRN

## 2020-06-18 MED ORDER — DICYCLOMINE HCL 10 MG PO CAPS
20.0000 mg | ORAL_CAPSULE | Freq: Once | ORAL | Status: AC
  Administered 2020-06-18: 20 mg via ORAL
  Filled 2020-06-18: qty 2

## 2020-06-18 MED ORDER — ONDANSETRON 8 MG PO TBDP: ORAL_TABLET | ORAL | 0 refills | Status: DC

## 2020-06-18 NOTE — ED Notes (Signed)
Pt did not feel dizzy when she was ambulated and walking. She did state that she felt a little sick to her stomach when she sat back down.

## 2020-06-18 NOTE — ED Provider Notes (Signed)
[facility] EMERGENCY DEPARTMENT Provider Note   CSN: [id number] Arrival date & time: 06/17/20  2208     History Chief Complaint  Patient presents with  . Dizziness    Jody Cooke is a 60 y.o. female.  The history is provided by the patient.  Dizziness Quality:  Head spinning Severity:  Severe Onset quality:  Sudden Timing:  Constant Progression:  Resolved Chronicity:  New Context: head movement   Relieved by:  Nothing Worsened by:  Nothing Ineffective treatments:  None tried Associated symptoms: nausea and vomiting   Associated symptoms: no blood in stool, no chest pain, no headaches, no hearing loss, no palpitations, no shortness of breath, no syncope, no tinnitus, no vision changes and no weakness   Associated symptoms comment:  Has chronic diarrhea  Risk factors: no anemia and no Meniere's disease   Patient presents with sudden onset vertigo with associated nausea and vomiting.  Symptoms were sudden and intense and lasted several hours and have now resolved.  No f/c/r.  No CP, no SOB.  No weakness no numbness no changes in vision or speech nor gait.       Past Medical History:  Diagnosis Date  . Chronic diarrhea   . Diabetes mellitus   . Grave's disease     Patient Active Problem List   Diagnosis Date Noted  . Chest pain in adult 01/14/2017  . Restless leg syndrome 09/10/2016  . Palpitations 08/30/2016  . Hypoglycemia due to type 1 diabetes mellitus ([place]) 06/25/2016  . Pure hypercholesterolemia 11/20/2009  . Type 1 diabetes mellitus ([place]) 11/20/2009  . 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 ABDOMINAL HYSTERECTOMY    . eye lid surgery       OB History   No obstetric history on file.     Family History  Problem Relation Age of Onset  . Alzheimer's disease Mother   . Hypertension Mother   . Scoliosis Mother   . Diabetes Mother   . Hernia Father     Social History   Tobacco Use   . Smoking status: Former Smoker    Packs/day: 1.00    Quit date: 1985    Years since quitting: 37.1  . Smokeless tobacco: Never Used  Vaping Use  . Vaping Use: Never used  Substance Use Topics  . Alcohol use: No  . Drug use: No    Home Medications Prior to Admission medications   Medication Sig Start Date End Date Taking? Authorizing Provider  acetaminophen (TYLENOL) 650 MG CR tablet Take by mouth.    [provider]  BD INSULIN SYRINGE U/F 30G X 1/2" 0.5 ML MISC USE TO INJECT INSULIN UP TO 6 TIMES DAILY 05/26/20   Philemon Kingdom, MD  CALCIUM PO Take 140 mg by mouth daily.    [provider]  CHIA SEED PO Take by mouth.    [provider]  Glucagon 3 MG/DOSE POWD Place 3 mg into the nose once as needed for up to 1 dose. 12/02/19   Philemon Kingdom, MD  glucose blood (ONETOUCH ULTRA) test strip USE TO CHECK BLOOD SUGAR 8 TIMES DAILY Dx: E10.9 03/02/20   Philemon Kingdom, MD  insulin degludec (TRESIBA FLEXTOUCH) 100 UNIT/ML FlexTouch Pen Inject 0.19 mLs (19 Units total) into the skin daily. Patient not taking: Reported on 04/04/2020 12/02/19   Philemon Kingdom, MD  Lancets Glory Rosebush ULTRASOFT) lancets  06/30/18   [provider]  LEVEMIR 100  UNIT/ML injection INJECT 0.16 TO 0.18 MLS (16-18 UNITS) INTO THE SKIN AT BEDTIME 12/27/19   Philemon Kingdom, MD  levothyroxine (SYNTHROID) 137 MCG tablet TAKE 1 TABLET BY MOUTH DAILY BEFORE BREAKFAST 03/14/20   Philemon Kingdom, MD  mometasone (ELOCON) 0.1 % cream Apply 1 application topically daily.      [provider]  NIACINAMIDE-ZINC-COPPER-FA PO Take by mouth.    [provider]  NOVOLOG 100 UNIT/ML injection Inject 4.5 - 10 units into the skin 3 times daily before meals 05/30/20   Philemon Kingdom, MD  Omega-3 Fatty Acids (FISH OIL PO) Take 300 mg by mouth.    [provider]  POTASSIUM PO Take 400 mg by mouth daily.    [provider]  UNABLE TO FIND Congaplex     [provider]  UNABLE TO FIND Spleen PMG    [provider]  UNABLE TO FIND Med Name: Cataplex F and Symplex F    [provider]  UNABLE TO FIND Med Name: [place] D    [provider]    Allergies    Aspirin, Milk-related compounds, Other, Penicillins, and Sulfa antibiotics  Review of Systems   Review of Systems  Constitutional: Negative for fever.  HENT: Negative for hearing loss and tinnitus.   Eyes: Negative for visual disturbance.  Respiratory: Negative for shortness of breath.   Cardiovascular: Negative for chest pain, palpitations and syncope.  Gastrointestinal: Positive for nausea and vomiting. Negative for blood in stool.  Genitourinary: Negative for difficulty urinating.  Musculoskeletal: Negative for arthralgias.  Skin: Negative for rash.  Neurological: Positive for dizziness. Negative for tremors, seizures, syncope, facial asymmetry, speech difficulty, weakness, light-headedness, numbness and headaches.  Psychiatric/Behavioral: Negative for agitation.  All other systems reviewed and are negative.   Physical Exam Updated Vital Signs BP (!) 144/65   Pulse 70   Temp 97.8 F (36.6 C) (Oral)   Resp 12   Ht 5\' 9"  (1.753 m)   Wt 68.3 kg   LMP 12/10/2011   SpO2 96%   BMI 22.22 kg/m   Physical Exam Vitals and nursing note reviewed.  Constitutional:      General: She is not in acute distress.    Appearance: Normal appearance.  HENT:     Head: Normocephalic and atraumatic.     Right Ear: Tympanic membrane normal.     Ears:     Comments: Scant fluid behind left TM    Nose: Nose normal.  Eyes:     Extraocular Movements: Extraocular movements intact.     Pupils: Pupils are equal, round, and reactive to light.  Cardiovascular:     Rate and Rhythm: Normal rate and regular rhythm.     Pulses: Normal pulses.     Heart sounds: Normal heart sounds.  Pulmonary:     Effort: Pulmonary effort is normal.     Breath sounds: Normal  breath sounds.  Abdominal:     General: Abdomen is flat. Bowel sounds are normal.     Palpations: Abdomen is soft.     Tenderness: There is no abdominal tenderness. There is no guarding.  Musculoskeletal:        General: Normal range of motion.     Cervical back: Normal range of motion and neck supple.  Skin:    General: Skin is warm and dry.     Capillary Refill: Capillary refill takes less than 2 seconds.  Neurological:     General: No focal deficit present.  Mental Status: She is alert and oriented to person, place, and time.     Cranial Nerves: No cranial nerve deficit.     Deep Tendon Reflexes: Reflexes normal.  Psychiatric:        Mood and Affect: Mood normal.        Behavior: Behavior normal.     ED Results / Procedures / Treatments   Labs (all labs ordered are listed, but only abnormal results are displayed) Results for orders placed or performed during the hospital encounter of 06/17/20  Lipase, blood  Result Value Ref Range   Lipase 57 (H) 11 - 51 U/L  Comprehensive metabolic panel  Result Value Ref Range   Sodium 134 (L) 135 - 145 mmol/L   Potassium 3.3 (L) 3.5 - 5.1 mmol/L   Chloride 97 (L) 98 - 111 mmol/L   CO2 27 22 - 32 mmol/L   Glucose, Bld 211 (H) 70 - 99 mg/dL   BUN 19 8 - 23 mg/dL   Creatinine, Ser 0.90 0.44 - 1.00 mg/dL   Calcium 9.1 8.9 - 10.3 mg/dL   Total Protein 7.5 6.5 - 8.1 g/dL   Albumin 4.3 3.5 - 5.0 g/dL   AST 22 15 - 41 U/L   ALT 19 0 - 44 U/L   Alkaline Phosphatase 48 38 - 126 U/L   Total Bilirubin 0.6 0.3 - 1.2 mg/dL   GFR, Estimated >60 >60 mL/min   Anion gap 10 5 - 15  CBC  Result Value Ref Range   WBC 8.0 4.0 - 10.5 K/uL   RBC 3.52 (L) 3.87 - 5.11 MIL/uL   Hemoglobin 11.8 (L) 12.0 - 15.0 g/dL   HCT 34.7 (L) 36.0 - 46.0 %   MCV 98.6 80.0 - 100.0 fL   MCH 33.5 26.0 - 34.0 pg   MCHC 34.0 30.0 - 36.0 g/dL   RDW 12.2 11.5 - 15.5 %   Platelets 237 150 - 400 K/uL   nRBC 0.0 0.0 - 0.2 %  CBG monitoring, ED  Result Value Ref  Range   Glucose-Capillary 187 (H) 70 - 99 mg/dL  CBG monitoring, ED  Result Value Ref Range   Glucose-Capillary 317 (H) 70 - 99 mg/dL   CT Head Wo Contrast  Result Date: 06/17/2020 CLINICAL DATA:  Vertigo/dizziness beginning tonight. EXAM: CT HEAD WITHOUT CONTRAST TECHNIQUE: Contiguous axial images were obtained from the base of the skull through the vertex without intravenous contrast. COMPARISON:  None. FINDINGS: Brain: The brain shows a normal appearance without evidence of malformation, atrophy, old or acute small or large vessel infarction, mass lesion, hemorrhage, hydrocephalus or extra-axial collection. Vascular: No hyperdense vessel. No evidence of atherosclerotic calcification. Skull: Normal.  No traumatic finding.  No focal bone lesion. Sinuses/Orbits: Sinuses are clear. Orbits appear normal. Mastoids are clear. Other: None significant IMPRESSION: Normal head CT. Electronically Signed   By: Nelson Chimes M.D.   On: 06/17/2020 23:23    EKG EKG Interpretation  Date/Time:  Saturday June 17 2020 22:31:42 EST Ventricular Rate:  71 PR Interval:    QRS Duration: 95 QT Interval:  397 QTC Calculation: 432 R Axis:   82 Text Interpretation: Sinus rhythm B Confirmed by Randal Buba, Maddyson Keil ([id number]) on 06/18/2020 12:05:18 AM   Radiology CT Head Wo Contrast  Result Date: 06/17/2020 CLINICAL DATA:  Vertigo/dizziness beginning tonight. EXAM: CT HEAD WITHOUT CONTRAST TECHNIQUE: Contiguous axial images were obtained from the base of the skull through the vertex without intravenous contrast. COMPARISON:  None. FINDINGS: Brain: The  brain shows a normal appearance without evidence of malformation, atrophy, old or acute small or large vessel infarction, mass lesion, hemorrhage, hydrocephalus or extra-axial collection. Vascular: No hyperdense vessel. No evidence of atherosclerotic calcification. Skull: Normal.  No traumatic finding.  No focal bone lesion. Sinuses/Orbits: Sinuses are clear. Orbits appear  normal. Mastoids are clear. Other: None significant IMPRESSION: Normal head CT. Electronically Signed   By: Nelson Chimes M.D.   On: 06/17/2020 23:23    Procedures Procedures   Medications Ordered in ED Medications  ondansetron [facility]) injection 4 mg (4 mg Intravenous Given 06/17/20 2252)  meclizine (ANTIVERT) tablet 25 mg (25 mg Oral Given 06/17/20 2319)  sodium chloride 0.9 % bolus 500 mL (0 mLs Intravenous Stopped 06/17/20 2354)  dicyclomine (BENTYL) capsule 20 mg (20 mg Oral Given 06/18/20 0033)    ED Course  I have reviewed the triage vital signs and the nursing notes.  Pertinent labs & imaging results that were available during my care of the patient were reviewed by me and considered in my medical decision making (see chart for details).    Symptoms consistent with Peripheral vertigo likely resulting from left ear. Symptoms were sudden onset, intense and with movement and this is consistent with peripheral vertigo. Patient has PO challenged and is able to ambulate without difficult.  Will discharge with meclizine and zofran and strict return precautions.    Jody Cooke was evaluated in Emergency Department on 06/18/2020 for the symptoms described in the history of present illness. She was evaluated in the context of the global COVID-19 pandemic, which necessitated consideration that the patient might be at risk for infection with the SARS-CoV-2 virus that causes COVID-19. Institutional protocols and algorithms that pertain to the evaluation of patients at risk for COVID-19 are in a state of rapid change based on information released by regulatory bodies including the CDC and federal and state organizations. These policies and algorithms were followed during the patient's care in the ED.  Final Clinical Impression(s) / ED Diagnoses Return for intractable cough, coughing up blood, fevers >100.4 unrelieved by medication, shortness of breath, intractable vomiting, chest pain, shortness of breath,  weakness, numbness, changes in speech, facial asymmetry, abdominal pain, passing out, Inability to tolerate liquids or food, cough, altered mental status or any concerns. No signs of systemic illness or infection. The patient is nontoxic-appearing on exam and vital signs are within normal limits.  I have reviewed the triage vital signs and the nursing notes. Pertinent labs & imaging results that were available during my care of the patient were reviewed by me and considered in my medical decision making (see chart for details). After history, exam, and medical workup I feel the patient has been appropriately medically screened and is safe for discharge home. Pertinent diagnoses were discussed with the patient. Patient was given return precautions.   Leatha Rohner, MD 06/18/20 [phone]

## 2020-06-20 DIAGNOSIS — Z79899 Other long term (current) drug therapy: Secondary | ICD-10-CM | POA: Diagnosis not present

## 2020-06-20 DIAGNOSIS — R2233 Localized swelling, mass and lump, upper limb, bilateral: Secondary | ICD-10-CM | POA: Diagnosis not present

## 2020-06-20 DIAGNOSIS — M7989 Other specified soft tissue disorders: Secondary | ICD-10-CM | POA: Diagnosis not present

## 2020-06-20 DIAGNOSIS — Z0000 Encounter for general adult medical examination without abnormal findings: Secondary | ICD-10-CM | POA: Diagnosis not present

## 2020-06-20 DIAGNOSIS — R42 Dizziness and giddiness: Secondary | ICD-10-CM | POA: Diagnosis not present

## 2020-06-20 DIAGNOSIS — K529 Noninfective gastroenteritis and colitis, unspecified: Secondary | ICD-10-CM | POA: Diagnosis not present

## 2020-06-20 DIAGNOSIS — Z0001 Encounter for general adult medical examination with abnormal findings: Secondary | ICD-10-CM | POA: Diagnosis not present

## 2020-07-12 DIAGNOSIS — D1801 Hemangioma of skin and subcutaneous tissue: Secondary | ICD-10-CM | POA: Diagnosis not present

## 2020-07-12 DIAGNOSIS — L821 Other seborrheic keratosis: Secondary | ICD-10-CM | POA: Diagnosis not present

## 2020-08-04 ENCOUNTER — Other Ambulatory Visit: Payer: Self-pay

## 2020-08-04 ENCOUNTER — Ambulatory Visit (INDEPENDENT_AMBULATORY_CARE_PROVIDER_SITE_OTHER): Payer: HMO | Admitting: Internal Medicine

## 2020-08-04 ENCOUNTER — Encounter: Payer: Self-pay | Admitting: Internal Medicine

## 2020-08-04 VITALS — BP 128/70 | HR 76 | Ht 69.0 in | Wt 147.0 lb

## 2020-08-04 DIAGNOSIS — E890 Postprocedural hypothyroidism: Secondary | ICD-10-CM

## 2020-08-04 DIAGNOSIS — E1042 Type 1 diabetes mellitus with diabetic polyneuropathy: Secondary | ICD-10-CM | POA: Diagnosis not present

## 2020-08-04 DIAGNOSIS — E785 Hyperlipidemia, unspecified: Secondary | ICD-10-CM

## 2020-08-04 LAB — POCT GLYCOSYLATED HEMOGLOBIN (HGB A1C): Hemoglobin A1C: 7.3 % — AB (ref 4.0–5.6)

## 2020-08-04 NOTE — Patient Instructions (Addendum)
Please continue: - Levemir 22.5 units at bedtime - NovoLog 1-5 units 3x a day before meals.  Try to correct lows at night with less carbs.  Please continue Levothyroxine 137 mcg daily.  Take the thyroid hormone every day, with water,  separated by at least 4 hours from: - acid reflux medications - calcium - iron - multivitamins  Please come back for a follow-up appointment in 4 months.

## 2020-08-04 NOTE — Progress Notes (Signed)
Patient ID: Jody Cooke, female   DOB: April 25, 1961, 60 y.o.   MRN: [id number]   This visit occurred during the SARS-CoV-2 public health emergency.  Safety protocols were in place, including screening questions prior to the visit, additional usage of staff PPE, and extensive cleaning of exam room while observing appropriate contact time as indicated for disinfecting solutions.   HPI: Jody Cooke is a 60 y.o.-year-old female, initially referred by her PCP, Dr. Ruben Gottron, for management of DM1, dx'ed at 60 y/o, uncontrolled, with long term complications (PN), also postablative hypothyroidism.  Last visit 4 months ago. She was previously seen by endocrinology at [facility], Dr. Meredith Pel in 2018, but she needed to change endocrinologists due to insurance restrictions.    Interim history: She recently had vertigo >> was in the ED - considered to be BPPV.  This episode has resolved. She OTW feels better, with less CP.  She previously saw cardiology for chest pain-had a stress test and an event monitor-no clear pathology found. Also, has increased allergies.  DM1:  Reviewed HbA1c levels: Lab Results  Component Value Date   HGBA1C 7.1 (A) 04/04/2020   HGBA1C 6.5 (A) 12/02/2019   HGBA1C 6.7 (A) 08/10/2019  06/30/2018: HbA1c 7.1% 03/24/2018: HbA1c 6.9% 12/19/2017: Hb A1c 6.3% 09/16/2017: HbA1c 6.7%  She is on: - Levemir 18.5 >> 22.5 units at bedtime - NovoLog  0.5-5.5 >> 2.5-5.5 >> 1-5 units 3x a day before meals Previously:   (she prefers her sugars to be at or around: 160)   Meter: One Touch Ultra.  She could not afford a CGM and also, she is not interested in getting one.  Pt checks her sugars 3-5 times a day per review of her detailed log: - am:  68-216, 243 >> 68, 80-270 >> 155-267, 291 (may eat during the night if she feels that her sugars are) - 2h after b'fast: 110 >> n/c >> 64, 430 >> n/c >> 61, 75-268 - before lunch: 68, 81-221, 259, 268 >> 54, 78-254, 260 >> 93-239 - 2h after lunch: n/c >> 69 -  before dinner: 50, 64, 73-210, 237, 301 >> 42, 101-371 >> 72, 89-218, 253 - 2h after dinner:  76, 123-250, 331 >> 70, 91-338 >> 75, 125, 131-236 - bedtime: see above - nighttime: 57, 91-217, 328 >> 129-280 >> 108-316 >> 85-248 Lowest sugar was 24 >> .Marland Kitchen.40s >> 50 >> 42 before dinner >> 69; she has hypoglycemia awareness at 60-100.  No history of hypoglycemia admissions.  She has an unexpired glucagon kit at home. Highest sugar was 346 >> 342 >> 430 >> 380.  No previous DKA admissions.    Pt's meals are: - Breakfast: Cereals - Lunch: Meat and bread - Dinner: Meat, bread, veggies - Snacks: No  -No CKD, last BUN/creatinine:  Lab Results  Component Value Date   BUN 19 06/17/2020   BUN 16 04/06/2019   CREATININE 0.90 06/17/2020   CREATININE 0.89 04/06/2019   -+ HL; last set of lipids: Lab Results  Component Value Date   CHOL 189 08/10/2019   HDL 66.60 08/10/2019   LDLCALC 113 (H) 08/10/2019   TRIG 46.0 08/10/2019   CHOLHDL 3 08/10/2019  06/17/2018: 216/94/76/121 She refused statins in the past due to negative publicity.  - last eye exam was on 03/15/2020; had one cataract surgery. She had a Laser tx >> helped a lot.  -She has numbness and tingling in her feet.  Normal foot exam in 11/2019.  I recommended alpha-lipoic acid and  B complex to help with her symptoms.  Pt has no FH of DM.  Postablative hypothyroidism:  She was diagnosed with Graves' disease in 2008.  She had RAI treatment then.  She has stable Graves' ophthalmopathy.  She had to have corrective eye surgery.  She was seen Dr. Bing Plume but changed after since then.  She denies blurry vision  Reviewed her TFTs: Lab Results  Component Value Date   TSH 2.17 12/02/2019   TSH 5.10 (H) 08/10/2019   TSH 2.27 04/06/2019   TSH 5.25 (H) 02/25/2019   TSH 7.92 (H) 01/05/2019   TSH 13.23 (H) 12/03/2018   TSH 3.02 08/13/2018  05/27/2018: TSH 8.65 03/24/2018: TSH 22.12  Pt is on levothyroxine 137 mcg daily, taken:  -At  night - at least 4h after dinner (per her preference) - + calcium in a.m. - no iron - no multivitamins - no PPIs - not on Biotin  No FH of thyroid cancer. No h/o radiation tx to head or neck other than RAI treatment.  No seaweed or kelp. No recent contrast studies. No Biotin use. No recent steroids use.   She takes herbal supplements (Spleen PMG, Simplex F, Cataplex F, Congaplex, etc).    Pt denies: - feeling nodules in neck - hoarseness - dysphagia - choking - SOB with lying down  He also has a history of chronic diarrhea, anal sphincter dysfunction, restless leg syndrome, dyslexia/ADD, osteopenia, IBS with diarrhea, vitiligo, OA, bursitis, tendinitis. She also has Raynaud's disease- her 4th autoimmune ds.  She has a history of vitamin D deficiency.  She is on vitamin D 4300 units daily.  Latest vitamin D level was excellent: Lab Results  Component Value Date   VD25OH 51.8 12/02/2019    Her husband is a Biomedical scientist.  ROS: Constitutional: no weight gain/no weight loss, no fatigue, no subjective hyperthermia, no subjective hypothermia Eyes: no blurry vision, no xerophthalmia ENT: no sore throat, + see HPI Cardiovascular: no CP/no SOB/no palpitations/no leg swelling Respiratory: no cough/no SOB/no wheezing Gastrointestinal: no N/no V/no D/no C/no acid reflux Musculoskeletal: no muscle aches/no joint aches Skin: no rashes, no hair loss Neurological: no tremors/+ numbness/+ tingling/no dizziness  I reviewed pt's medications, allergies, PMH, social hx, family hx, and changes were documented in the history of present illness. Otherwise, unchanged from my initial visit note.  Past Medical History:  Diagnosis Date  . Chronic diarrhea   . Diabetes mellitus   . Grave's disease    Past Surgical History:  Procedure Laterality Date  . ABDOMINAL HYSTERECTOMY    . eye lid surgery     Social History   Socioeconomic History  . Marital status: Married    Spouse name: Not on file  .  Number of children: Not on file  . Years of education: Not on file  . Highest education level: Not on file  Occupational History  .  Disabled  Social Needs  . Financial resource strain: Not on file  . Food insecurity:    Worry: Never true    Inability: Never true  . Transportation needs:    Medical: No    Non-medical: No  Tobacco Use  . Smoking status: Former Smoker    Packs/day: 1.00    Last attempt to quit: 1984    Years since quitting: 35.2  . Smokeless tobacco: Never Used  Substance and Sexual Activity  . Alcohol use: No  . Drug use: No  . Sexual activity: Not on file  Lifestyle  . Physical activity:  Walking    Days per week:  Daily   Current Outpatient Medications on File Prior to Visit  Medication Sig Dispense Refill  . acetaminophen (TYLENOL) 650 MG CR tablet Take by mouth.    . BD INSULIN SYRINGE U/F 30G X 1/2" 0.5 ML MISC USE TO INJECT INSULIN UP TO 6 TIMES DAILY 200 each 3  . CALCIUM PO Take 140 mg by mouth daily.    Marland Kitchen CHIA SEED PO Take by mouth.    . Glucagon 3 MG/DOSE POWD Place 3 mg into the nose once as needed for up to 1 dose. 1 each 11  . glucose blood (ONETOUCH ULTRA) test strip USE TO CHECK BLOOD SUGAR 8 TIMES DAILY Dx: E10.9 800 each 10  . insulin degludec (TRESIBA FLEXTOUCH) 100 UNIT/ML FlexTouch Pen Inject 0.19 mLs (19 Units total) into the skin daily. (Patient not taking: Reported on 04/04/2020) 5 pen 3  . Lancets (ONETOUCH ULTRASOFT) lancets     . LEVEMIR 100 UNIT/ML injection INJECT 0.16 TO 0.18 MLS (16-18 UNITS) INTO THE SKIN AT BEDTIME 10 mL 4  . levothyroxine (SYNTHROID) 137 MCG tablet TAKE 1 TABLET BY MOUTH DAILY BEFORE BREAKFAST 90 tablet 1  . meclizine (ANTIVERT) 12.5 MG tablet Take 1 tablet (12.5 mg total) by mouth 3 (three) times daily as needed for dizziness. 12 tablet 0  . mometasone (ELOCON) 0.1 % cream Apply 1 application topically daily.      Marland Kitchen NIACINAMIDE-ZINC-COPPER-FA PO Take by mouth.    Marland Kitchen NOVOLOG 100 UNIT/ML injection Inject 4.5 - 10  units into the skin 3 times daily before meals 10 mL 0  . Omega-3 Fatty Acids (FISH OIL PO) Take 300 mg by mouth.    . ondansetron (ZOFRAN ODT) 8 MG disintegrating tablet 4m ODT q8 hours prn nausea 6 tablet 0  . POTASSIUM PO Take 400 mg by mouth daily.    .Marland KitchenUNABLE TO FIND Congaplex    . UNABLE TO FIND Spleen PMG    . UNABLE TO FIND Med Name: Cataplex F and [place]     No current facility-administered medications on file prior to visit.   Allergies  Allergen Reactions  . Aspirin Anaphylaxis  . Milk-Related Compounds     *also allergic to soy milk  And rice milk   . Other   . Penicillins Rash    Allergic since childhood  . Sulfa Antibiotics Diarrhea    Tree nuts   Family History  Problem Relation Age of Onset  . Alzheimer's disease Mother   . Hypertension Mother   . Scoliosis Mother   . Diabetes Mother   . Hernia Father    PE: BP 128/70 (BP Location: Right Arm, Patient Position: Sitting, Cuff Size: Normal)   Pulse 76   Ht '5\' 9"'  (1.753 m)   Wt 147 lb (66.7 kg)   LMP 12/10/2011   SpO2 96%   BMI 21.71 kg/m  Wt Readings from Last 3 Encounters:  08/04/20 147 lb (66.7 kg)  06/17/20 150 lb 8 oz (68.3 kg)  04/04/20 149 lb 9.6 oz (67.9 kg)   Constitutional: normal weight, in NAD Eyes: PERRLA, EOMI, + bilateral symmetric exophthalmos ENT: moist mucous membranes, no thyromegaly, no cervical lymphadenopathy Cardiovascular: RRR, No MRG Respiratory: CTA B Gastrointestinal: abdomen soft, NT, ND, BS+ Musculoskeletal: no deformities, strength intact in all 4 Skin: moist, warm, no rashes Neurological: no tremor with outstretched hands, DTR normal in all 4  ASSESSMENT: 1. DM1, uncontrolled, with complications - PN  2. Postablative hypothyroidism  3. HL  PLAN:  1. Patient with longstanding, uncontrolled type 1 diabetes, on a basal-bolus since the regimen.  She is very sensitive and we use low doses of insulin for her.  She is  keeping excellent records of her blood sugars, insulin doses and also diet.  Sugars are usually very fluctuating and she does have an excellent job adjusting her doses of insulin based on her meals, her blood sugars before meals and previous observed pattern.  I did recommend a CGM for her in the past but she refused one.  She is also quite resistant in changing her regimen. -At last visit, sugars were slightly higher than before but also with less lows.  She has an occasional blood sugar in the 50s or 60s and 1 at 42 before dinner.  She was adjusting the dose of insulin based on patterns and insulin to carb ratios.  We did not change the regimen at that time.of note, in the past, I suggested to switch to [place], but she was reticent to change the insulins that she was using. At last visit, I suggested an [place] and discussed about benefits.  Given a brochure.  However, she is not interested in this. -At today's visit, we reviewed together her blood sugars at home.  They are extremely fluctuating, without consistent pattern.  Her sugars in the morning appear to be higher, especially if she is eating at night.  She tells me that she wakes up at night and sometimes feels that her sugars are dropping (however, this is not necessary seen on the glucometer) and eats between 2 and 8 g of carbs.  Because the sugars in the morning are frequently in the 90s, I advised him to eat mostly between 2 to 4 g of carbs.  I would not suggest other changes for now.  We did discuss that if the sugars start decreasing again, the lack of the Levemir.  -I recommended:  Patient Instructions  Please continue: - Levemir 22.5 units at bedtime - NovoLog 1-5 units 3x a day before meals.  Try to correct lows at night with less carbs.  Please continue Levothyroxine 137 mcg daily.  Take the thyroid hormone every day, with water,  separated by at least 4 hours from: - acid reflux medications - calcium - iron -  multivitamins  Please come back for a follow-up appointment in 4 months.  - we checked her HbA1c: 7.3% (higher) - advised to check sugars at different times of the day - 4-8x a day, rotating check times - advised for yearly eye exams >> she is UTD - return to clinic in 3-4 months  2. Postablative hypothyroidism -She developed hypothyroidism after RAI treatment in 2008 for Graves' disease. -She has stable Graves' ophthalmopathy - saw ophthalmologist on 03/15/2020 (second opinion).  At that time, it was mentioned that she had stable Graves' ophthalmopathy, dry eyes, cataracts. - latest thyroid labs reviewed with pt >> normal: Lab Results  Component Value Date   TSH 2.17 12/02/2019   - she continues on LT4 137 mcg daily - pt feels good on this dose. - we discussed about taking the thyroid hormone every day, with water, separated by >4 hours from acid reflux medications, calcium, iron, multivitamins. Pt. is taking it correctly, however, she prefers to take it at night. - will check thyroid tests today: TSH and fT4 - If labs are abnormal, she will need to  return for repeat TFTs in 1.5 months  3. HL -Reviewed latest lipid panel from 07/2019: LDL above target: Lab Results  Component Value Date   CHOL 189 08/10/2019   HDL 66.60 08/10/2019   LDLCALC 113 (H) 08/10/2019   TRIG 46.0 08/10/2019   CHOLHDL 3 08/10/2019  -She refused a statin in the past -She is due for another lipid panel-we will check this today (nonfasting).  Component     Latest Ref Rng & Units 08/08/2020  Cholesterol     0 - 200 mg/dL 194  Triglycerides     0.0 - 149.0 mg/dL 33.0  HDL Cholesterol     >39.00 mg/dL 67.80  VLDL     0.0 - 40.0 mg/dL 6.6  LDL (calc)     0 - 99 mg/dL 119 (H)  Total CHOL/HDL Ratio      3  NonHDL      125.96  TSH     0.35 - 4.50 uIU/mL 14.12 (H)  T4,Free(Direct)     0.60 - 1.60 ng/dL 0.89  LDL is approximately stable from before, higher than goal.  The rest of the  lipids are at  goal. Her TSH is elevated, unexplained, since we did not change her dose since last check.  I will check with her if she missed any doses.  Philemon Kingdom, MD PhD [facility] Endocrinology

## 2020-08-08 ENCOUNTER — Other Ambulatory Visit (INDEPENDENT_AMBULATORY_CARE_PROVIDER_SITE_OTHER): Payer: HMO

## 2020-08-08 ENCOUNTER — Other Ambulatory Visit: Payer: Self-pay

## 2020-08-08 DIAGNOSIS — E785 Hyperlipidemia, unspecified: Secondary | ICD-10-CM

## 2020-08-08 DIAGNOSIS — E890 Postprocedural hypothyroidism: Secondary | ICD-10-CM

## 2020-08-08 LAB — LIPID PANEL
Cholesterol: 194 mg/dL (ref 0–200)
HDL: 67.8 mg/dL (ref >39.00)
LDL Cholesterol: 119 mg/dL — ABNORMAL HIGH (ref 0–99)
NonHDL: 125.96
Total CHOL/HDL Ratio: 3
Triglycerides: 33 mg/dL (ref 0.0–149.0)
VLDL: 6.6 mg/dL (ref 0.0–40.0)

## 2020-08-08 LAB — T4, FREE: Free T4: 0.89 ng/dL (ref 0.60–1.60)

## 2020-08-08 LAB — TSH: TSH: 14.12 u[IU]/mL — ABNORMAL HIGH (ref 0.35–4.50)

## 2020-08-09 ENCOUNTER — Telehealth: Payer: Self-pay

## 2020-08-09 DIAGNOSIS — E890 Postprocedural hypothyroidism: Secondary | ICD-10-CM

## 2020-08-09 MED ORDER — LEVOTHYROXINE SODIUM 150 MCG PO TABS: 150.0000 ug | ORAL_TABLET | Freq: Every day | ORAL | 0 refills | Status: DC

## 2020-08-09 NOTE — Telephone Encounter (Signed)
Called and spoke with pt regarding lab results and confirmed no dosages missed. Rx for Levothyroxine 150 mcg sent to preferred pharmacy. Labs ordered for 6 weeks.

## 2020-08-09 NOTE — Telephone Encounter (Signed)
-----   Message from Philemon Kingdom, MD sent at 08/09/2020 10:21 AM EDT ----- Can you please call pt.:  LDL is approximately stable from before, higher than goal.  The rest of the  lipids are at goal. Her TSH is elevated, unexplained, since we did not change her dose since last check.  Please check with her if she missed any doses or changed anything in how she is taking it.  If not, will need to increase the dose to 150 mcg daily and recheck a TSH and a free T4 in 1.5 months. Can you please order these?

## 2020-08-10 ENCOUNTER — Telehealth: Payer: Self-pay | Admitting: Internal Medicine

## 2020-08-10 NOTE — Telephone Encounter (Signed)
Patient called re: Patient states CMA asked her if she was doing anything different that would effect dosage of Levothyroxine. Patient states she has been in discussion with Dr. Cruzita Lederer telling Dr. that she takes her Levothyroxine at bedtime. Also, Patient states she takes a lot of Vitamin D -4,800 IU daily.Patient states she is working with a person who is knowledgable re: Vitamin D. Patient states if any questions, please contact Patient at ph# [phone] (permission to leave detailed message on voice mail if Patient is unable to answer call).

## 2020-08-11 NOTE — Telephone Encounter (Signed)
Pt is aware of my recommendation is to actually take the levothyroxine the morning, but she mentions that she cannot take it in the morning.  We can expect more variability in her TSH if she takes it at night, as per previous discussions.  For now, let us increase the dose as discussed and recheck the tests in 1.5 months. Regarding the vitamin D, she can continue to do what she is doing if the levels are at goal.  Per PCP.

## 2020-08-11 NOTE — Telephone Encounter (Signed)
Called pt at [phone] and left a detailed message relaying Dr's message: Pt is aware of my recommendation is to actually take the levothyroxine the morning, but she mentions that she cannot take it in the morning.  We can expect more variability in her TSH if she takes it at night, as per previous discussions.  For now, let us increase the dose as discussed and recheck the tests in 1.5 months. Regarding the vitamin D, she can continue to do what she is doing if the levels are at goal.  Per PCP. Pt advised to call back with any questions or concerns.

## 2020-08-11 NOTE — Telephone Encounter (Signed)
Per pt she had not missed any doses and I sent 150 mcg to the pharmacy for her to switch to. Please advise if the time of day she taking her Levothyroxine is effecting her levels. As well as vitamin D supplements.

## 2020-08-29 DIAGNOSIS — I7300 Raynaud's syndrome without gangrene: Secondary | ICD-10-CM | POA: Diagnosis not present

## 2020-08-29 DIAGNOSIS — R768 Other specified abnormal immunological findings in serum: Secondary | ICD-10-CM | POA: Diagnosis not present

## 2020-08-29 DIAGNOSIS — Z6821 Body mass index (BMI) 21.0-21.9, adult: Secondary | ICD-10-CM | POA: Diagnosis not present

## 2020-08-29 DIAGNOSIS — T691XXD Chilblains, subsequent encounter: Secondary | ICD-10-CM | POA: Diagnosis not present

## 2020-08-30 DIAGNOSIS — R768 Other specified abnormal immunological findings in serum: Secondary | ICD-10-CM | POA: Diagnosis not present

## 2020-08-30 DIAGNOSIS — E890 Postprocedural hypothyroidism: Secondary | ICD-10-CM | POA: Diagnosis not present

## 2020-08-30 DIAGNOSIS — E109 Type 1 diabetes mellitus without complications: Secondary | ICD-10-CM | POA: Diagnosis not present

## 2020-08-30 DIAGNOSIS — E785 Hyperlipidemia, unspecified: Secondary | ICD-10-CM | POA: Diagnosis not present

## 2020-08-30 DIAGNOSIS — E559 Vitamin D deficiency, unspecified: Secondary | ICD-10-CM | POA: Diagnosis not present

## 2020-09-05 ENCOUNTER — Other Ambulatory Visit: Payer: Self-pay | Admitting: Internal Medicine

## 2020-09-11 DIAGNOSIS — R197 Diarrhea, unspecified: Secondary | ICD-10-CM | POA: Diagnosis not present

## 2020-09-19 DIAGNOSIS — E1069 Type 1 diabetes mellitus with other specified complication: Secondary | ICD-10-CM | POA: Diagnosis not present

## 2020-09-21 ENCOUNTER — Other Ambulatory Visit (INDEPENDENT_AMBULATORY_CARE_PROVIDER_SITE_OTHER): Payer: HMO

## 2020-09-21 ENCOUNTER — Other Ambulatory Visit: Payer: Self-pay

## 2020-09-21 DIAGNOSIS — E890 Postprocedural hypothyroidism: Secondary | ICD-10-CM | POA: Diagnosis not present

## 2020-09-21 LAB — T4, FREE: Free T4: 0.91 ng/dL (ref 0.60–1.60)

## 2020-09-21 LAB — TSH: TSH: 15.21 u[IU]/mL — ABNORMAL HIGH (ref 0.35–4.50)

## 2020-09-22 ENCOUNTER — Telehealth: Payer: Self-pay

## 2020-09-22 DIAGNOSIS — E890 Postprocedural hypothyroidism: Secondary | ICD-10-CM

## 2020-09-22 MED ORDER — LEVOTHYROXINE SODIUM 175 MCG PO TABS: 175.0000 ug | ORAL_TABLET | Freq: Every day | ORAL | 3 refills | Status: DC

## 2020-09-22 NOTE — Telephone Encounter (Addendum)
Called and discuss results with pt. She can not take her levo in the morning new rx sent to preferred pharmacy for 175 mcg. ----- Message from Philemon Kingdom, MD sent at 09/22/2020 10:17 AM EDT ----- Can you please call pt.:  Patient's TSH remains high, and did not change significantly since we increased the dose of levothyroxine to 150 mcg daily.  As discussed with her in the past, this is most likely due to low absorption of levothyroxine as she takes this at night as opposed to in the morning.  I would again recommend that she moves the levothyroxine in the morning for better absorption, but if she would not want to do this, we need to increase the dose to 175 mcg daily.  Per her decision, please send this dose to the pharmacy for 60 days with 3 refills, if needed.  She has an appointment with me in July and I will recheck her tests then.

## 2020-10-03 ENCOUNTER — Ambulatory Visit: Payer: HMO

## 2020-10-19 ENCOUNTER — Other Ambulatory Visit: Payer: Self-pay | Admitting: Internal Medicine

## 2020-10-19 DIAGNOSIS — E1042 Type 1 diabetes mellitus with diabetic polyneuropathy: Secondary | ICD-10-CM

## 2020-10-23 ENCOUNTER — Telehealth: Payer: Self-pay | Admitting: Internal Medicine

## 2020-10-23 NOTE — Telephone Encounter (Signed)
MEDICATION: Novolog vial  PHARMACY:  [facility] [address]  HAS THE PATIENT CONTACTED THEIR PHARMACY?  yes  IS PATIENT OUT OF MEDICATION: yes  IF NOT; HOW MUCH IS LEFT: none  LAST APPOINTMENT DATE: @6 /01/2021  NEXT APPOINTMENT DATE:@7 /15/2022  DO WE HAVE YOUR PERMISSION TO LEAVE A DETAILED MESSAGE?: yes  OTHER COMMENTS: called pharmacy on 10/19/20 and pharmacy said they sent it over to [place] - patein calling to follow up

## 2020-10-25 NOTE — Telephone Encounter (Signed)
Rx sent to preferred pharmacy.

## 2020-11-09 ENCOUNTER — Other Ambulatory Visit: Payer: Self-pay | Admitting: Internal Medicine

## 2020-11-24 ENCOUNTER — Encounter: Payer: Self-pay | Admitting: Internal Medicine

## 2020-11-24 ENCOUNTER — Other Ambulatory Visit (INDEPENDENT_AMBULATORY_CARE_PROVIDER_SITE_OTHER): Payer: HMO

## 2020-11-24 ENCOUNTER — Other Ambulatory Visit: Payer: Self-pay

## 2020-11-24 ENCOUNTER — Ambulatory Visit (INDEPENDENT_AMBULATORY_CARE_PROVIDER_SITE_OTHER): Payer: HMO | Admitting: Internal Medicine

## 2020-11-24 VITALS — BP 120/70 | HR 69 | Ht 69.0 in | Wt 145.0 lb

## 2020-11-24 DIAGNOSIS — E785 Hyperlipidemia, unspecified: Secondary | ICD-10-CM

## 2020-11-24 DIAGNOSIS — E890 Postprocedural hypothyroidism: Secondary | ICD-10-CM | POA: Diagnosis not present

## 2020-11-24 DIAGNOSIS — E1042 Type 1 diabetes mellitus with diabetic polyneuropathy: Secondary | ICD-10-CM | POA: Diagnosis not present

## 2020-11-24 LAB — POCT GLYCOSYLATED HEMOGLOBIN (HGB A1C): Hemoglobin A1C: 6.9 % — AB (ref 4.0–5.6)

## 2020-11-24 LAB — TSH: TSH: 0.53 u[IU]/mL (ref 0.35–5.50)

## 2020-11-24 LAB — T4, FREE: Free T4: 1.51 ng/dL (ref 0.60–1.60)

## 2020-11-24 NOTE — Progress Notes (Addendum)
Patient ID: Jody Cooke, female   DOB: April 16, 1961, 60 y.o.   MRN: [id number]   This visit occurred during the SARS-CoV-2 public health emergency.  Safety protocols were in place, including screening questions prior to the visit, additional usage of staff PPE, and extensive cleaning of exam room while observing appropriate contact time as indicated for disinfecting solutions.   HPI: Jody Cooke is a 60 y.o.-year-old female, initially referred by her PCP, Dr. Ruben Gottron, for management of DM1, dx'ed at 60 y/o, uncontrolled, with long term complications (PN), also postablative hypothyroidism.  Last visit 4 months ago. She was previously seen by endocrinology at [facility], Dr. Meredith Pel in 2018, but she needed to change endocrinologists due to insurance restrictions.    Interim history: No increased urination, blurry vision, nausea, chest pain. She continues to have diarrhea. Since last visit, she saw Dr. Steffanie Dunn for a second opinion about fluctuating blood sugars but now returns to see me. She recently saw her previous thyroid specialist for uncontrolled hypothyroidism -she advised her to stop her iodine supplement.  DM1:  Reviewed HbA1c levels: Lab Results  Component Value Date   HGBA1C 7.3 (A) 08/04/2020   HGBA1C 7.1 (A) 04/04/2020   HGBA1C 6.5 (A) 12/02/2019  06/30/2018: HbA1c 7.1% 03/24/2018: HbA1c 6.9% 12/19/2017: Hb A1c 6.3% 09/16/2017: HbA1c 6.7%  She is on: - Levemir 18.5 >> 22.5 units at bedtime - NovoLog  0.5-5.5 >> 2.5-5.5 >> 1-5 units 3x a day before meals Previously:   (she prefers her sugars to be at or around: 160)  Meter: One Touch Ultra.   She could not afford a CGM and also, she is not interested in getting one.  Pt checks her sugars 3-5 times a day per review of her detailed log: - am:  68-216, 243 >> 68, 80-270 >> 155-267, 291 >> 56, 116-239 - 2h after b'fast: 110 >> n/c >> 64, 430 >> n/c >> 61, 75-268 >> n/c - before lunch:  54, 78-254, 260 >> 93-239 >> 69, 101-212, 246 - 2h after  lunch: n/c >> 69 >> 110 - before dinner: 42, 101-371 >> 72, 89-218, 253 >> 68-150, 264 - 2h after dinner:  76, 123-250, 331 >> 70, 91-338 >> 75, 125, 131-236 >> 109-219, 288 - bedtime: see above - nighttime: 57, 91-217, 328 >> 129-280 >> 108-316 >> 85-248 >> 126-252 Lowest sugar was 24 >> .Marland KitchenMarland Kitchen42 before dinner >> 69 >> 56; she has hypoglycemia awareness at 60-100.  No history of hypoglycemia admissions.  She has an unexpired glucagon kit at home. Highest sugar was 430 >> 380 >> 288.  No previous DKA admissions.    Pt's meals are: - Breakfast: Cereals - Lunch: Meat and bread - Dinner: Meat, bread, veggies - Snacks: No  -No CKD, last BUN/creatinine:  Lab Results  Component Value Date   BUN 19 06/17/2020   BUN 16 04/06/2019   CREATININE 0.90 06/17/2020   CREATININE 0.89 04/06/2019   -+ HL; last set of lipids: Lab Results  Component Value Date   CHOL 194 08/08/2020   HDL 67.80 08/08/2020   LDLCALC 119 (H) 08/08/2020   TRIG 33.0 08/08/2020   CHOLHDL 3 08/08/2020  06/17/2018: 216/94/76/121 She refused statins due to negative publicity.  - last eye exam was on 03/15/2020; had one cataract surgery. She had Laser tx >> helped a lot.  -She has numbness and tingling in her feet.  Normal foot exam in 11/2019.  I recommended alpha-lipoic acid and B complex to help with her symptoms.  She did not start.  Pt has no FH of DM.  Uncontrolled postablative hypothyroidism:  She was diagnosed with Graves' disease in 2008.  She had RAI treatment then.  She has stable Graves' ophthalmopathy.  She had to have corrective eye surgery.  She was seen Dr. Bing Plume but changed after since then.  She denies blurry vision  Reviewed her TFTs: Lab Results  Component Value Date   TSH 15.21 (H) 09/21/2020   TSH 14.12 (H) 08/08/2020   TSH 2.17 12/02/2019   TSH 5.10 (H) 08/10/2019   TSH 2.27 04/06/2019   TSH 5.25 (H) 02/25/2019   TSH 7.92 (H) 01/05/2019   TSH 13.23 (H) 12/03/2018   TSH 3.02 08/13/2018   05/27/2018: TSH 8.65 03/24/2018: TSH 22.12  Pt is on levothyroxine 175 mcg daily, taken:  -At night! -She would not want to move in the morning despite repeated advised to do so. - at least 4h after dinner (per her preference) - + calcium supplement 3 times a day - + spleen extract 3 times a day with meals - no iron - no multivitamins - no PPIs - not on Biotin  No FH of thyroid cancer. No h/o radiation tx to head or neck other than RAI treatment.  No recent contrast studies. No Biotin use. No recent steroids use.   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    Pt denies: - feeling nodules in neck - hoarseness - dysphagia - choking - SOB with lying down  He also has a history of chronic diarrhea, anal sphincter dysfunction, IBS, restless leg syndrome, dyslexia/ADD, osteopenia, vitiligo, OA, bursitis, tendinitis. She also has Raynaud's disease- her 4th autoimmune ds.  She has a history of vitamin D deficiency.  She is on vitamin D 4300 units daily.  Latest vitamin D level was excellent: 08/30/2020: Vitamin D 66 Lab Results  Component Value Date   VD25OH 51.8 12/02/2019    Her husband is a Biomedical scientist.  ROS: + See HPI  Past Medical History:  Diagnosis Date   Chronic diarrhea    Diabetes mellitus    Grave's disease    Past Surgical History:  Procedure Laterality Date   ABDOMINAL HYSTERECTOMY     eye lid surgery     Social History   Socioeconomic History   Marital status: Married    Spouse name: Not on file   Number of children: Not on file   Years of education: Not on file   Highest education level: Not on file  Occupational History    Disabled  Social Needs   Financial resource strain: Not on file   Food insecurity:    Worry: Never true    Inability: Never true   Transportation needs:    Medical: No    Non-medical: No  Tobacco Use   Smoking status: Former Smoker    Packs/day: 1.00     Last attempt to quit: 1984    Years since quitting: 35.2   Smokeless tobacco: Never Used  Substance and Sexual Activity   Alcohol use: No   Drug use: No   Sexual activity: Not on file  Lifestyle   Physical activity: Walking    Days per week:  Daily   Current Outpatient Medications on File Prior to Visit  Medication Sig Dispense Refill   acetaminophen (TYLENOL) 650 MG CR tablet Take by mouth.     BD INSULIN SYRINGE U/F 30G X 1/2" 0.5 ML  MISC INJECT UP TO 6 TIMES A DAY 200 each 3   CALCIUM PO Take 140 mg by mouth daily.     CHIA SEED PO Take by mouth.     Glucagon 3 MG/DOSE POWD Place 3 mg into the nose once as needed for up to 1 dose. 1 each 11   glucose blood (ONETOUCH ULTRA) test strip USE TO CHECK BLOOD SUGAR 8 TIMES DAILY Dx: E10.9 800 each 10   insulin degludec (TRESIBA FLEXTOUCH) 100 UNIT/ML FlexTouch Pen Inject 0.19 mLs (19 Units total) into the skin daily. (Patient not taking: Reported on 04/04/2020) 5 pen 3   Lancets (ONETOUCH ULTRASOFT) lancets      LEVEMIR 100 UNIT/ML injection INJECT 16 TO 18 UNITS INTO THE SKIN AT BEDTIME 10 mL 4   levothyroxine (SYNTHROID) 175 MCG tablet Take 1 tablet (175 mcg total) by mouth daily before breakfast. 60 tablet 3   meclizine (ANTIVERT) 12.5 MG tablet Take 1 tablet (12.5 mg total) by mouth 3 (three) times daily as needed for dizziness. 12 tablet 0   mometasone (ELOCON) 0.1 % cream Apply 1 application topically daily.       NIACINAMIDE-ZINC-COPPER-FA PO Take by mouth.     NOVOLOG 100 UNIT/ML injection INJECT 4.5 TO 10 UNITS INTO THE SKIN 3 TIMES DAILY BEFORE MEALS 10 mL 1   Omega-3 Fatty Acids (FISH OIL PO) Take 300 mg by mouth.     ondansetron (ZOFRAN ODT) 8 MG disintegrating tablet 19m ODT q8 hours prn nausea 6 tablet 0   POTASSIUM PO Take 400 mg by mouth daily.     UNABLE TO FIND Congaplex     UNABLE TO FIND Spleen PMG     UNABLE TO FIND Med Name: [place]aplex F and [place]     No current  facility-administered medications on file prior to visit.   Allergies  Allergen Reactions   Aspirin Anaphylaxis   Milk-Related Compounds     *also allergic to soy milk  And rice milk    Other    Penicillins Rash    Allergic since childhood   Sulfa Antibiotics Diarrhea    Tree nuts   Family History  Problem Relation Age of Onset   Alzheimer's disease Mother    Hypertension Mother    Scoliosis Mother    Diabetes Mother    Hernia Father    PE: BP 120/70 (BP Location: Right Arm, Patient Position: Sitting, Cuff Size: Normal)   Pulse 69   Ht '5\' 9"'  (1.753 m)   Wt 145 lb (65.8 kg)   LMP 12/10/2011   SpO2 98%   BMI 21.41 kg/m  Wt Readings from Last 3 Encounters:  11/24/20 145 lb (65.8 kg)  08/04/20 147 lb (66.7 kg)  06/17/20 150 lb 8 oz (68.3 kg)   Constitutional: normal weight, in NAD Eyes: PERRLA, EOMI, + bilateral symmetric exophthalmos ENT: moist mucous membranes, no thyromegaly, no cervical lymphadenopathy Cardiovascular: RRR, No MRG Respiratory: CTA B Gastrointestinal: abdomen soft, NT, ND, BS+ Musculoskeletal: no deformities, strength intact in all 4 Skin: moist, warm, no rashes Neurological: no tremor with outstretched hands, DTR normal in all 4  ASSESSMENT: 1. DM1, uncontrolled, with complications - PN  2. Postablative hypothyroidism  3. HL  PLAN:  1.  Complex patient with longstanding, uncontrolled, type 1 diabetes on a basal-bolus insulin regimen.  She is very insulin sensitive and we use lower doses of insulin for her.  She is  keeping very good records of her blood sugars and insulin doses given.  She adjusts her insulin based on her meals and observed glucose pattern.  She refused to try a CGM in the past.  She also is reticent to suggestions to change her insulin doses. -At last visit, sugars were still extremely fluctuating, without consistent pattern.  Sugars in the morning appears to be higher especially when she was eating at night.  She was telling me  that she would wake up at night and felt that her sugars are dropping (this was not necessarily supported by her meter) and would eat between 2 and 8 g of carbs.  We discussed about reducing this but ideally not eating if the sugars were not low. -At today's visit, sugars appear to be slightly less fluctuating than before.  She is very happy with how the sugars are doing now.  She continues to adjust the doses of her insulin based on observed patterns. -Patient is resistant to suggestions to make changes in her regimen or in her blood sugar monitoring (refuses CGM).  In this case, unfortunately, we are limited in what we can do for her diabetes control... At today's visit, I advised her to continue the current regimen.  I advised her to try not to overcorrect high or low blood sugars. -I recommended:  Patient Instructions  Please continue: - Levemir 22.5 units at bedtime - NovoLog 1-5 units 3x a day before meals.  Please continue Levothyroxine 175 mcg daily.  Take the thyroid hormone every day, with water,  separated by at least 4 hours from: - acid reflux medications - calcium - iron - multivitamins  Please come back for a follow-up appointment in 4 months.  - we checked her HbA1c: 6.9% (lower) - advised to check sugars at different times of the day - 4x a day, rotating check times - advised for yearly eye exams >> she is UTD - return to clinic in 4-6 months  2. Postablative hypothyroidism -Developed after RAI treatment for Graves' disease in 2008 -She has stable Graves' ophthalmopathy - saw ophthalmologist on 03/15/2020 (second opinion).  At that time, it was mentioned that she had stable Graves' ophthalmopathy, dry eyes, cataracts. -Her TSH remains high as she is resistance to moving levothyroxine in the morning as suggested but continues to take it at night, after she takes calcium supplements with dinner Lab Results  Component Value Date   TSH 15.21 (H) 09/21/2020  - she continues  on LT4 175 mcg daily - we discussed about taking the thyroid hormone every day, with water, >30 minutes before breakfast, separated by >4 hours from acid reflux medications, calcium, iron, multivitamins. Pt. is NOT taking it correctly (see above).  We discussed that taking a thyroid support supplement or stopping iodine will have no effect on her thyroid function, since we are giving her the entire dose of thyroid hormones with the associated, complexed, iodine, from outside.  However, she seems to be convinced that now that she stopped her iodine supplement 2 months ago and after her iodine is out of her system (?)  Her TFTs will improve. -We discussed at length about the advantages of moving levothyroxine in the morning even though she may need to take her calcium or spleen supplement only 1-1/2 to 2 hours after levothyroxine.  We also discussed about possibly stopping the spleen supplement but she was told in the past that her bulging eyes are related to a dysfunction in her spleen.  I  explained that this was most likely related to Graves' disease but she felt she benefited from the spleen supplement.  She would want to wait until 4 months for now and repeat her TFTs then and if not at goal, she will reconsider moving the the levothyroxine in the morning. - will check thyroid tests today per her request: TSH and fT4 - If labs are abnormal, she will need to return for repeat TFTs in 1.5 months  3. HL -Reviewed latest lipid panel from 07/2020: LDL above target: Lab Results  Component Value Date   CHOL 194 08/08/2020   HDL 67.80 08/08/2020   LDLCALC 119 (H) 08/08/2020   TRIG 33.0 08/08/2020   CHOLHDL 3 08/08/2020  -She refused statins  Component     Latest Ref Rng & Units 11/24/2020  TSH     0.35 - 5.50 uIU/mL 0.53  T4,Free(Direct)     0.60 - 1.60 ng/dL 1.51   Her TFTs are normal now on the higher dose of levothyroxine.  Philemon Kingdom, MD PhD [facility] Endocrinology

## 2020-11-24 NOTE — Patient Instructions (Addendum)
Please continue: - Levemir 22.5 units at bedtime - NovoLog 1-5 units 3x a day before meals.  Please continue Levothyroxine 175 mcg daily.  Take the thyroid hormone every day, with water,  separated by at least 4 hours from: - acid reflux medications - calcium - iron - multivitamins  Please come back for a follow-up appointment in 4 months.

## 2020-11-28 DIAGNOSIS — R42 Dizziness and giddiness: Secondary | ICD-10-CM | POA: Diagnosis not present

## 2020-11-28 DIAGNOSIS — H9313 Tinnitus, bilateral: Secondary | ICD-10-CM | POA: Diagnosis not present

## 2020-11-29 ENCOUNTER — Telehealth: Payer: Self-pay | Admitting: Internal Medicine

## 2020-11-29 NOTE — Telephone Encounter (Signed)
Patient wants to know if she can take levothyroxine with a probiotic? Patient state you can a message if she does not pick up.

## 2020-11-30 NOTE — Telephone Encounter (Signed)
Jody Cooke, She would ideally take the probiotics at least 2 hours after levothyroxine, but if she takes levothyroxine at the end of the day, I would suggest to take the probiotics in the morning.

## 2020-11-30 NOTE — Telephone Encounter (Signed)
Please advise 

## 2020-11-30 NOTE — Telephone Encounter (Signed)
Called and spoke advised pt how to take her levothyroxine and probiotics, informed in detail that she can at least 2 hours after taking her thyroid. Pt stated that she was confused that  is not she in not  understanding, I explain to the patient that she would take her levothyroxine first then 2 hours later take her probiotic I asked patient what she was confused about she said that she would discuss this when come in for her appt.

## 2020-12-19 DIAGNOSIS — H9313 Tinnitus, bilateral: Secondary | ICD-10-CM | POA: Diagnosis not present

## 2020-12-19 DIAGNOSIS — H93A3 Pulsatile tinnitus, bilateral: Secondary | ICD-10-CM | POA: Diagnosis not present

## 2020-12-19 DIAGNOSIS — R42 Dizziness and giddiness: Secondary | ICD-10-CM | POA: Diagnosis not present

## 2020-12-19 DIAGNOSIS — E0500 Thyrotoxicosis with diffuse goiter without thyrotoxic crisis or storm: Secondary | ICD-10-CM | POA: Diagnosis not present

## 2020-12-19 DIAGNOSIS — H903 Sensorineural hearing loss, bilateral: Secondary | ICD-10-CM | POA: Diagnosis not present

## 2020-12-27 DIAGNOSIS — L650 Telogen effluvium: Secondary | ICD-10-CM | POA: Diagnosis not present

## 2020-12-28 ENCOUNTER — Encounter (HOSPITAL_BASED_OUTPATIENT_CLINIC_OR_DEPARTMENT_OTHER): Payer: Self-pay | Admitting: *Deleted

## 2020-12-28 ENCOUNTER — Emergency Department (HOSPITAL_BASED_OUTPATIENT_CLINIC_OR_DEPARTMENT_OTHER)
Admission: EM | Admit: 2020-12-28 | Discharge: 2020-12-28 | Disposition: A | Discharge status: Home / Self Care | Payer: HMO | Attending: Emergency Medicine | Admitting: Emergency Medicine

## 2020-12-28 ENCOUNTER — Other Ambulatory Visit: Payer: Self-pay

## 2020-12-28 DIAGNOSIS — E109 Type 1 diabetes mellitus without complications: Secondary | ICD-10-CM | POA: Diagnosis not present

## 2020-12-28 DIAGNOSIS — H9319 Tinnitus, unspecified ear: Secondary | ICD-10-CM | POA: Diagnosis not present

## 2020-12-28 DIAGNOSIS — E039 Hypothyroidism, unspecified: Secondary | ICD-10-CM | POA: Insufficient documentation

## 2020-12-28 DIAGNOSIS — R634 Abnormal weight loss: Secondary | ICD-10-CM | POA: Diagnosis not present

## 2020-12-28 DIAGNOSIS — Z79899 Other long term (current) drug therapy: Secondary | ICD-10-CM | POA: Diagnosis not present

## 2020-12-28 DIAGNOSIS — Z87891 Personal history of nicotine dependence: Secondary | ICD-10-CM | POA: Insufficient documentation

## 2020-12-28 DIAGNOSIS — R42 Dizziness and giddiness: Secondary | ICD-10-CM

## 2020-12-28 DIAGNOSIS — Z794 Long term (current) use of insulin: Secondary | ICD-10-CM | POA: Insufficient documentation

## 2020-12-28 DIAGNOSIS — L659 Nonscarring hair loss, unspecified: Secondary | ICD-10-CM | POA: Diagnosis not present

## 2020-12-28 LAB — CBG MONITORING, ED: Glucose-Capillary: 164 mg/dL — ABNORMAL HIGH (ref 70–99)

## 2020-12-28 MED ORDER — MECLIZINE HCL 25 MG PO TABS: 25.0000 mg | ORAL_TABLET | Freq: Three times a day (TID) | ORAL | 0 refills | Status: AC | PRN

## 2020-12-28 MED ORDER — ONDANSETRON HCL 4 MG PO TABS: 4.0000 mg | ORAL_TABLET | Freq: Three times a day (TID) | ORAL | 0 refills | Status: AC | PRN

## 2020-12-28 NOTE — ED Notes (Signed)
Patient with history of vertigo. States usual methods of lying on floor and staring at spot have not been helping. Has been taking meclizine for last three days, states has been helping some but not getting rid of it. Also states is nauseous. Denies CP, SOB, fever. Able to walk to room with steady gait. Alert and oriented x4, respirations even and unlabored. Call bell in reach.

## 2020-12-28 NOTE — ED Provider Notes (Signed)
[facility] Provider Note   CSN: [id number] Arrival date & time: 12/28/20  2111     History Chief Complaint  Patient presents with   Dizziness    Jody Cooke is a 59 y.o. female.  The history is provided by the patient.  Dizziness Quality:  Vertigo Severity:  Mild Onset quality:  Gradual Duration:  3 days Timing:  Intermittent Progression:  Waxing and waning Chronicity:  Recurrent Context: head movement   Relieved by:  Nothing Worsened by:  Nothing Associated symptoms: tinnitus   Associated symptoms: no blood in stool, no chest pain, no diarrhea, no headaches, no hearing loss, no nausea, no palpitations, no shortness of breath, no syncope, no vision changes, no vomiting and no weakness   Risk factors: hx of vertigo       Past Medical History:  Diagnosis Date   Chronic diarrhea    Diabetes mellitus    Grave's disease     Patient Active Problem List   Diagnosis Date Noted   Chest pain in adult 01/14/2017   Restless leg syndrome 09/10/2016   Palpitations 08/30/2016   Hypoglycemia due to type 1 diabetes mellitus ([place]) 06/25/2016   Pure hypercholesterolemia 11/20/2009   Type 1 diabetes mellitus ([place]) 11/20/2009   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     Family History  Problem Relation Age of Onset   Alzheimer's disease Mother    Hypertension Mother    Scoliosis Mother    Diabetes Mother    Hernia Father     Social History   Tobacco Use   Smoking status: Former    Packs/day: 1.00    Types: Cigarettes    Quit date: 1985    Years since quitting: 37.6   Smokeless tobacco: Never  Vaping Use   Vaping Use: Never used  Substance Use Topics   Alcohol use: No   Drug use: No    Home Medications Prior to Admission medications   Medication Sig Start Date End Date  Taking? Authorizing Provider  ondansetron (ZOFRAN) 4 MG tablet Take 1 tablet (4 mg total) by mouth every 8 (eight) hours as needed for up to 20 days for nausea or vomiting. 12/28/20 01/17/21 Yes Ignatius Kloos, DO  acetaminophen (TYLENOL) 650 MG CR tablet Take by mouth.    [provider]  BD INSULIN SYRINGE U/F 30G X 1/2" 0.5 ML MISC INJECT UP TO 6 TIMES A DAY 11/10/20   Philemon Kingdom, MD  CALCIUM PO Take 140 mg by mouth daily.    [provider]  CHIA SEED PO Take by mouth.    [provider]  Glucagon 3 MG/DOSE POWD Place 3 mg into the nose once as needed for up to 1 dose. 12/02/19   Philemon Kingdom, MD  glucose blood (ONETOUCH ULTRA) test strip USE TO CHECK BLOOD SUGAR 8 TIMES DAILY Dx: E10.9 03/02/20   Philemon Kingdom, MD  Lancets (ONETOUCH ULTRASOFT) lancets  06/30/18   [provider]  LEVEMIR 100 UNIT/ML injection INJECT 16 TO 18 UNITS INTO THE SKIN AT BEDTIME Patient taking differently: Inject 22 Units into the skin daily. 09/06/20   Philemon Kingdom, MD  levothyroxine (SYNTHROID) 175 MCG tablet Take 1 tablet (175 mcg total) by mouth daily before breakfast. 09/22/20   Philemon Kingdom, MD  meclizine (ANTIVERT) 25 MG tablet Take 1 tablet (25 mg total) by mouth 3 (three) times daily as needed for up to 30 doses for dizziness. 12/28/20   Giavanna Kang, DO  mometasone (ELOCON) 0.1 % cream Apply 1 application topically daily.      [provider]  NIACINAMIDE-ZINC-COPPER-FA PO Take by mouth.    [provider]  NOVOLOG 100 UNIT/ML injection INJECT 4.5 TO 10 UNITS INTO THE SKIN 3 TIMES DAILY BEFORE MEALS 10/23/20   Philemon Kingdom, MD  Omega-3 Fatty Acids (FISH OIL PO) Take 300 mg by mouth.    [provider]  POTASSIUM PO Take 400 mg by mouth daily.    [provider]  UNABLE TO FIND Congaplex    [provider]  UNABLE TO FIND Spleen PMG    [provider]  UNABLE TO FIND Med Name: Cataplex F and  Symplex F    [provider]  UNABLE TO FIND Med Name: [place] D    [provider]    Allergies    Aspirin, Milk-related compounds, Other, Penicillins, and Sulfa antibiotics  Review of Systems   Review of Systems  Constitutional:  Negative for chills and fever.  HENT:  Positive for tinnitus. Negative for ear pain, hearing loss and sore throat.   Eyes:  Negative for pain and visual disturbance.  Respiratory:  Negative for cough and shortness of breath.   Cardiovascular:  Negative for chest pain, palpitations and syncope.  Gastrointestinal:  Negative for abdominal pain, blood in stool, diarrhea, nausea and vomiting.  Genitourinary:  Negative for dysuria and hematuria.  Musculoskeletal:  Negative for arthralgias and back pain.  Skin:  Negative for color change and rash.  Neurological:  Positive for dizziness. Negative for tremors, seizures, syncope, facial asymmetry, speech difficulty, weakness, light-headedness, numbness and headaches.  All other systems reviewed and are negative.  Physical Exam Updated Vital Signs BP (!) 134/44 (BP Location: Right Arm)   Pulse 65   Temp 98.2 F (36.8 C) (Oral)   Resp 18   Ht '5\' 9"'$  (1.753 m)   Wt 65.8 kg   LMP 12/10/2011   SpO2 97%   BMI 21.42 kg/m   Physical Exam Vitals and nursing note reviewed.  Constitutional:      General: She is not in acute distress.    Appearance: She is well-developed. She is not ill-appearing.  HENT:     Head: Normocephalic and atraumatic.     Nose: Nose normal.     Mouth/Throat:     Mouth: Mucous membranes are moist.  Eyes:     Extraocular Movements: Extraocular movements intact.     Conjunctiva/sclera: Conjunctivae normal.     Pupils: Pupils are equal, round, and reactive to light.  Cardiovascular:     Rate and Rhythm: Normal rate and regular rhythm.     Pulses: Normal pulses.     Heart sounds: Normal heart sounds. No murmur heard. Pulmonary:     Effort: Pulmonary effort is normal.  No respiratory distress.     Breath sounds: Normal breath sounds.  Abdominal:     Palpations: Abdomen is soft.     Tenderness: There is no abdominal tenderness.  Musculoskeletal:     Cervical back: Normal range of motion and neck supple.  Skin:    General: Skin is warm and dry.  Neurological:     General: No focal deficit present.     Mental Status: She is alert and oriented to person, place, and time.  Cranial Nerves: No cranial nerve deficit.     Sensory: No sensory deficit.     Motor: No weakness.     Coordination: Coordination normal.     Gait: Gait normal.     Comments: 5+ out of 5 strength throughout, normal sensation, normal gait, normal finger-to-nose finger, normal speech, no visual field deficit    ED Results / Procedures / Treatments   Labs (all labs ordered are listed, but only abnormal results are displayed) Labs Reviewed  CBG MONITORING, ED - Abnormal; Notable for the following components:      Result Value   Glucose-Capillary 164 (*)    All other components within normal limits    EKG EKG Interpretation  Date/Time:  Thursday December 28 2020 22:04:06 EDT Ventricular Rate:  59 PR Interval:  129 QRS Duration: 83 QT Interval:  412 QTC Calculation: 409 R Axis:   81 Text Interpretation: Sinus rhythm Probable left atrial enlargement Borderline right axis deviation Confirmed by Ronnald Nian, Aslynn Brunetti ([id number]) on 12/28/2020 10:21:58 PM  Radiology No results found.  Procedures Procedures   Medications Ordered in ED Medications - No data to display  ED Course  I have reviewed the triage vital signs and the nursing notes.  Pertinent labs & imaging results that were available during my care of the patient were reviewed by me and considered in my medical decision making (see chart for details).    MDM Rules/Calculators/A&P                           ADIYAH WADLER is a 60 year old female with history of diabetes, Graves' disease who presents the ED with vertigo type  symptoms.  Normal vitals.  No fever.  Is currently being worked up by ENT for benign positional vertigo.  She has pulsatile tinnitus that was diagnosed.  She has been feeling dizziness for the last several days with some improvement with meclizine and Zofran.  She is totally asymptomatic at this time.  She took meclizine about 2 hours prior to arrival and now that she is here she feels better.  Neurologically she is intact.  She has normal finger-to-nose finger, normal speech, normal gait.  I have no concern for stroke at this time.  She already follows with ENT.  She may need vestibular therapy.  She was given information on how to do the Epley maneuver.  We will give her additional dose of meclizine and Zofran for home because she does not have much left of these prescriptions.  Discharged in good condition.  Understands return precautions.  This chart was dictated using voice recognition software.  Despite best efforts to proofread,  errors can occur which can change the documentation meaning.  Final Clinical Impression(s) / ED Diagnoses Final diagnoses:  Dizziness  Vertigo    Rx / DC Orders ED Discharge Orders          Ordered    meclizine (ANTIVERT) 25 MG tablet  3 times daily PRN        12/28/20 2227    ondansetron (ZOFRAN) 4 MG tablet  Every 8 hours PRN        12/28/20 2227             Lennice Sites, DO 12/28/20 2227

## 2020-12-28 NOTE — Discharge Instructions (Addendum)
Follow up with ENT  

## 2020-12-28 NOTE — ED Triage Notes (Signed)
Vertigo unrelieved with Meclizine x 3 days. She is ambulatory.

## 2021-01-02 ENCOUNTER — Other Ambulatory Visit: Payer: Self-pay | Admitting: Internal Medicine

## 2021-01-02 DIAGNOSIS — E1042 Type 1 diabetes mellitus with diabetic polyneuropathy: Secondary | ICD-10-CM

## 2021-01-02 DIAGNOSIS — E109 Type 1 diabetes mellitus without complications: Secondary | ICD-10-CM | POA: Diagnosis not present

## 2021-01-03 DIAGNOSIS — I6329 Cerebral infarction due to unspecified occlusion or stenosis of other precerebral arteries: Secondary | ICD-10-CM | POA: Diagnosis not present

## 2021-01-03 DIAGNOSIS — H8113 Benign paroxysmal vertigo, bilateral: Secondary | ICD-10-CM | POA: Diagnosis not present

## 2021-01-03 DIAGNOSIS — E1043 Type 1 diabetes mellitus with diabetic autonomic (poly)neuropathy: Secondary | ICD-10-CM | POA: Diagnosis not present

## 2021-01-10 DIAGNOSIS — H9319 Tinnitus, unspecified ear: Secondary | ICD-10-CM | POA: Diagnosis not present

## 2021-01-10 DIAGNOSIS — H9313 Tinnitus, bilateral: Secondary | ICD-10-CM | POA: Diagnosis not present

## 2021-01-10 DIAGNOSIS — R42 Dizziness and giddiness: Secondary | ICD-10-CM | POA: Diagnosis not present

## 2021-01-24 DIAGNOSIS — R159 Full incontinence of feces: Secondary | ICD-10-CM | POA: Diagnosis not present

## 2021-01-24 DIAGNOSIS — Z23 Encounter for immunization: Secondary | ICD-10-CM | POA: Diagnosis not present

## 2021-01-24 DIAGNOSIS — R152 Fecal urgency: Secondary | ICD-10-CM | POA: Diagnosis not present

## 2021-01-24 DIAGNOSIS — E559 Vitamin D deficiency, unspecified: Secondary | ICD-10-CM | POA: Diagnosis not present

## 2021-01-24 DIAGNOSIS — L659 Nonscarring hair loss, unspecified: Secondary | ICD-10-CM | POA: Diagnosis not present

## 2021-01-24 DIAGNOSIS — R42 Dizziness and giddiness: Secondary | ICD-10-CM | POA: Diagnosis not present

## 2021-01-31 DIAGNOSIS — H02411 Mechanical ptosis of right eyelid: Secondary | ICD-10-CM | POA: Diagnosis not present

## 2021-01-31 DIAGNOSIS — Z961 Presence of intraocular lens: Secondary | ICD-10-CM | POA: Diagnosis not present

## 2021-01-31 DIAGNOSIS — Z794 Long term (current) use of insulin: Secondary | ICD-10-CM | POA: Diagnosis not present

## 2021-01-31 DIAGNOSIS — H5203 Hypermetropia, bilateral: Secondary | ICD-10-CM | POA: Diagnosis not present

## 2021-01-31 DIAGNOSIS — R42 Dizziness and giddiness: Secondary | ICD-10-CM | POA: Diagnosis not present

## 2021-01-31 DIAGNOSIS — H2511 Age-related nuclear cataract, right eye: Secondary | ICD-10-CM | POA: Diagnosis not present

## 2021-01-31 DIAGNOSIS — H524 Presbyopia: Secondary | ICD-10-CM | POA: Diagnosis not present

## 2021-01-31 DIAGNOSIS — H52223 Regular astigmatism, bilateral: Secondary | ICD-10-CM | POA: Diagnosis not present

## 2021-01-31 DIAGNOSIS — H25011 Cortical age-related cataract, right eye: Secondary | ICD-10-CM | POA: Diagnosis not present

## 2021-01-31 DIAGNOSIS — H0520 Unspecified exophthalmos: Secondary | ICD-10-CM | POA: Diagnosis not present

## 2021-01-31 DIAGNOSIS — E103292 Type 1 diabetes mellitus with mild nonproliferative diabetic retinopathy without macular edema, left eye: Secondary | ICD-10-CM | POA: Diagnosis not present

## 2021-01-31 DIAGNOSIS — H04123 Dry eye syndrome of bilateral lacrimal glands: Secondary | ICD-10-CM | POA: Diagnosis not present

## 2021-02-20 ENCOUNTER — Telehealth: Payer: Self-pay | Admitting: Internal Medicine

## 2021-02-20 NOTE — Telephone Encounter (Signed)
FYI, Patient getting care from a new Dr that is closer to home-Patient has difficulty driving-Patient cancelled appointment 03/14/21

## 2021-02-28 NOTE — Telephone Encounter (Signed)
FYI

## 2021-03-08 ENCOUNTER — Other Ambulatory Visit: Payer: Self-pay | Admitting: Internal Medicine

## 2021-03-08 ENCOUNTER — Telehealth: Payer: Self-pay | Admitting: Internal Medicine

## 2021-03-08 DIAGNOSIS — E1042 Type 1 diabetes mellitus with diabetic polyneuropathy: Secondary | ICD-10-CM

## 2021-03-08 NOTE — Telephone Encounter (Signed)
Patient called to request the following RX -Patient states she will run out of the medication listed below before Patient is able to see her new treating Physician:  MEDICATION: NOVOLOG 100 UNIT/ML injection  PHARMACY:   [id number] - [address] Phone:  [phone]  Fax:  [phone]      HAS THE PATIENT CONTACTED [facility]?  Yes-requires new RX  IS THIS A 90 DAY SUPPLY : ?  IS PATIENT OUT OF MEDICATION: No  IF NOT; HOW MUCH IS LEFT: Approx. 2 weeks  LAST APPOINTMENT DATE: @10 /27/2022  NEXT APPOINTMENT DATE:@Visit  date not found  DO WE HAVE YOUR PERMISSION TO LEAVE A DETAILED MESSAGE?: Yes  OTHER COMMENTS:    **Let patient know to contact pharmacy at the end of the day to make sure medication is ready. **  ** Please notify patient to allow 48-72 hours to process**  **Encourage patient to contact the pharmacy for refills or they can request refills through [facility]**

## 2021-03-09 NOTE — Telephone Encounter (Signed)
A courtesy refill has been sent with 21 refill to cover pt until she is able to see new provider.

## 2021-03-12 DIAGNOSIS — Z1231 Encounter for screening mammogram for malignant neoplasm of breast: Secondary | ICD-10-CM | POA: Diagnosis not present

## 2021-03-14 ENCOUNTER — Ambulatory Visit: Payer: HMO | Admitting: Internal Medicine

## 2021-03-27 DIAGNOSIS — K529 Noninfective gastroenteritis and colitis, unspecified: Secondary | ICD-10-CM | POA: Diagnosis not present

## 2021-03-27 DIAGNOSIS — K6289 Other specified diseases of anus and rectum: Secondary | ICD-10-CM | POA: Diagnosis not present

## 2021-03-27 DIAGNOSIS — K9049 Malabsorption due to intolerance, not elsewhere classified: Secondary | ICD-10-CM | POA: Diagnosis not present

## 2021-03-27 DIAGNOSIS — R634 Abnormal weight loss: Secondary | ICD-10-CM | POA: Diagnosis not present

## 2021-03-28 DIAGNOSIS — R922 Inconclusive mammogram: Secondary | ICD-10-CM | POA: Diagnosis not present

## 2021-03-28 DIAGNOSIS — R928 Other abnormal and inconclusive findings on diagnostic imaging of breast: Secondary | ICD-10-CM | POA: Diagnosis not present

## 2021-04-02 ENCOUNTER — Other Ambulatory Visit: Payer: Self-pay | Admitting: Internal Medicine

## 2021-04-02 ENCOUNTER — Telehealth: Payer: Self-pay | Admitting: Internal Medicine

## 2021-04-02 DIAGNOSIS — E1042 Type 1 diabetes mellitus with diabetic polyneuropathy: Secondary | ICD-10-CM

## 2021-04-02 MED ORDER — ONETOUCH ULTRA VI STRP: ORAL_STRIP | 1 refills | Status: AC

## 2021-04-02 NOTE — Telephone Encounter (Signed)
Patient's test strips now sent in to [facility]

## 2021-04-02 NOTE — Telephone Encounter (Signed)
PT request refill RX on the following: glucose blood (ONETOUCH ULTRA) test strip to send to    [facility] [id number] - [place] Phone:  [phone]  Fax:  [phone]    90 day supply.

## 2021-04-04 DIAGNOSIS — D2371 Other benign neoplasm of skin of right lower limb, including hip: Secondary | ICD-10-CM | POA: Diagnosis not present

## 2021-04-06 ENCOUNTER — Other Ambulatory Visit: Payer: Self-pay | Admitting: Internal Medicine

## 2021-04-06 DIAGNOSIS — E890 Postprocedural hypothyroidism: Secondary | ICD-10-CM

## 2021-04-11 DIAGNOSIS — E890 Postprocedural hypothyroidism: Secondary | ICD-10-CM | POA: Diagnosis not present

## 2021-04-11 DIAGNOSIS — Z79899 Other long term (current) drug therapy: Secondary | ICD-10-CM | POA: Diagnosis not present

## 2021-04-11 DIAGNOSIS — E109 Type 1 diabetes mellitus without complications: Secondary | ICD-10-CM | POA: Diagnosis not present

## 2021-04-18 ENCOUNTER — Other Ambulatory Visit: Payer: Self-pay | Admitting: Internal Medicine

## 2021-04-18 NOTE — Telephone Encounter (Signed)
Pt established care with another endocrinologist.  Further refills sent to them.

## 2021-06-21 LAB — EXTERNAL GENERIC LAB PROCEDURE: COLOGUARD: NEGATIVE

## 2021-12-29 ENCOUNTER — Other Ambulatory Visit: Payer: Self-pay | Admitting: Internal Medicine

## 2021-12-29 DIAGNOSIS — E1042 Type 1 diabetes mellitus with diabetic polyneuropathy: Secondary | ICD-10-CM

## 2022-01-15 ENCOUNTER — Other Ambulatory Visit: Payer: Self-pay | Admitting: Internal Medicine

## 2022-01-15 DIAGNOSIS — E1042 Type 1 diabetes mellitus with diabetic polyneuropathy: Secondary | ICD-10-CM

## 2022-03-16 ENCOUNTER — Other Ambulatory Visit: Payer: Self-pay | Admitting: Internal Medicine

## 2022-03-16 DIAGNOSIS — E1042 Type 1 diabetes mellitus with diabetic polyneuropathy: Secondary | ICD-10-CM

## 2022-08-08 ENCOUNTER — Encounter: Payer: Self-pay | Admitting: Podiatry

## 2022-08-08 ENCOUNTER — Ambulatory Visit: Payer: HMO | Admitting: Podiatry

## 2022-08-08 DIAGNOSIS — L603 Nail dystrophy: Secondary | ICD-10-CM

## 2022-08-08 DIAGNOSIS — L600 Ingrowing nail: Secondary | ICD-10-CM | POA: Diagnosis not present

## 2022-08-08 NOTE — Patient Instructions (Signed)

## 2022-08-08 NOTE — Progress Notes (Signed)
  Subjective:  Patient ID: Jody Cooke, female    DOB: 10/25/1960,   MRN: [id number]  Chief Complaint  Patient presents with   Ingrown Toenail    Bilateral hallux nail removal     62 y.o. female presents for concern of bilateral great toe nails. She has been dealing with ingrowns and pain in the nails for years. Has had them removed twice and grown back the same. Relates she is ready to have them permanently taken care of.  . Denies any other pedal complaints. Denies n/v/f/c.   Past Medical History:  Diagnosis Date   Chronic diarrhea    Diabetes mellitus    Grave's disease     Objective:  Physical Exam: Vascular: DP/PT pulses 2/4 bilateral. CFT <3 seconds. Normal hair growth on digits. No edema.  Skin. No lacerations or abrasions bilateral feet. Bilateral hallux nails are thickened and dystrophic and tender to touch.  Musculoskeletal: MMT 5/5 bilateral lower extremities in DF, PF, Inversion and Eversion. Deceased ROM in DF of ankle joint.  Neurological: Sensation intact to light touch.   Assessment:   1. Dystrophic nail   2. Ingrown right greater toenail   3. Ingrown left greater toenail      Plan:  Patient was evaluated and treated and all questions answered. Discussed ingrown toenails etiology and treatment options including procedure for removal vs conservative care.  Patient requesting removal of ingrown nail today. Procedure below.  Discussed procedure and post procedure care and patient expressed understanding.  Will follow-up in 2 weeks for nail check or sooner if any problems arise.    Procedure:  Procedure: total Nail Avulsion of bilaterally hallux nail Surgeon: Lorenda Peck, DPM  Pre-op Dx: Ingrown toenail without infection Post-op: Same  Place of Surgery: Office exam room.  Indications for surgery: Painful and ingrown toenail.    The patient is requesting removal of nail with chemical matrixectomy. Risks and complications were discussed with the patient  for which they understand and written consent was obtained. Under sterile conditions a total of 3 mL of  1% lidocaine plain was infiltrated in a hallux block fashion. Once anesthetized, the skin was prepped in sterile fashion. A tourniquet was then applied. Next the entire hallux nail bilateral was removed. Marland Kitchen  Next phenol was then applied under standard conditions and copiously irrigated. Silvadene was applied. A dry sterile dressing was applied. After application of the dressing the tourniquet was removed and there is found to be an immediate capillary refill time to the digit. The patient tolerated the procedure well without any complications. Post procedure instructions were discussed the patient for which he verbally understood. Follow-up in two weeks for nail check or sooner if any problems are to arise. Discussed signs/symptoms of infection and directed to call the office immediately should any occur or go directly to the emergency room. In the meantime, encouraged to call the office with any questions, concerns, changes symptoms.   Lorenda Peck, DPM

## 2022-08-14 ENCOUNTER — Telehealth: Payer: Self-pay | Admitting: *Deleted

## 2022-08-14 ENCOUNTER — Other Ambulatory Visit: Payer: Self-pay | Admitting: Podiatry

## 2022-08-14 MED ORDER — MUPIROCIN 2 % EX OINT: 1.0000 | TOPICAL_OINTMENT | Freq: Two times a day (BID) | CUTANEOUS | 2 refills | Status: AC

## 2022-08-14 NOTE — Telephone Encounter (Signed)
Patient has been updated.

## 2022-08-14 NOTE — Telephone Encounter (Signed)
Patient is calling to ask if she can use distilled vinegar vs regular vinegar ,also a prescription for mupirocin ointment was supposed to be sent to pharmacy.please advise

## 2022-08-19 ENCOUNTER — Other Ambulatory Visit: Payer: Self-pay | Admitting: Podiatry

## 2022-08-19 ENCOUNTER — Telehealth: Payer: Self-pay | Admitting: Podiatry

## 2022-08-19 ENCOUNTER — Telehealth: Payer: Self-pay | Admitting: *Deleted

## 2022-08-19 MED ORDER — DOXYCYCLINE HYCLATE 100 MG PO TABS: 100.0000 mg | ORAL_TABLET | Freq: Two times a day (BID) | ORAL | 0 refills | Status: DC

## 2022-08-19 NOTE — Telephone Encounter (Signed)
Spoke with patient to update that an antibiotic has been sent to her pharmacy,verbalized understanding but said that she just finished azithromycin-250 mg which she started on 03/28 for 5 days , is suppose to continue to work. Should she start the new prescription right away? Please advise.

## 2022-08-19 NOTE — Telephone Encounter (Signed)
Some drainage and swelling can be normal. I will go ahead and send in antibitoic for her to start taking and we should be ok to see her on Friday.  Thanks

## 2022-08-19 NOTE — Telephone Encounter (Signed)
Pt has concerns with foot, pts foot is oozing/draining and minimal welling. She just wants to know if this is normal and okay to wait until her appt fu this Friday 4/12.  Please advise

## 2022-08-19 NOTE — Telephone Encounter (Signed)
Patient has been updated to start new prescription right away.

## 2022-08-22 ENCOUNTER — Ambulatory Visit: Payer: HMO | Admitting: Podiatry

## 2022-08-22 ENCOUNTER — Encounter: Payer: Self-pay | Admitting: Podiatry

## 2022-08-22 DIAGNOSIS — L600 Ingrowing nail: Secondary | ICD-10-CM

## 2022-08-22 DIAGNOSIS — L603 Nail dystrophy: Secondary | ICD-10-CM

## 2022-08-22 NOTE — Progress Notes (Signed)
  Subjective:  Patient ID: PRIANKA SUTHARD, female    DOB: 12/20/1960,   MRN: [id number]  Chief Complaint  Patient presents with   Ingrown Toenail    Bilateral great toe nails f/u    62 y.o. female presents for follow-up of bilateral hallux nail avulsions. Realtes she has been soaking as instructed and taking antibiotics that was called into for her a few days ago.  . Denies any other pedal complaints. Denies n/v/f/c.   Past Medical History:  Diagnosis Date   Chronic diarrhea    Diabetes mellitus    Grave's disease     Objective:  Physical Exam: Vascular: DP/PT pulses 2/4 bilateral. CFT <3 seconds. Normal hair growth on digits. No edema.  Skin. No lacerations or abrasions bilateral feet. Bilateral hallux nail beds appear to be healing well. Some mild erythema noted to the base of the nail bed. No pain to palpation. No purulence or edema noted.  Musculoskeletal: MMT 5/5 bilateral lower extremities in DF, PF, Inversion and Eversion. Deceased ROM in DF of ankle joint.  Neurological: Sensation intact to light touch.   Assessment:   1. Ingrown right greater toenail   2. Ingrown left greater toenail   3. Dystrophic nail      Plan:  Patient was evaluated and treated and all questions answered. Toe was evaluated and appears to be healing well.  May discontinue soaks and neosporin.  Taper off over the next week every other day and leave open to air at night.  Patient to follow-up as needed.    Louann Sjogren, DPM

## 2022-08-23 ENCOUNTER — Ambulatory Visit: Payer: HMO | Admitting: Podiatry

## 2022-08-23 ENCOUNTER — Other Ambulatory Visit: Payer: Self-pay | Admitting: Podiatry

## 2022-08-23 ENCOUNTER — Telehealth: Payer: Self-pay

## 2022-08-23 MED ORDER — DOXYCYCLINE HYCLATE 100 MG PO TABS: 100.0000 mg | ORAL_TABLET | Freq: Two times a day (BID) | ORAL | 0 refills | Status: AC

## 2022-08-23 NOTE — Telephone Encounter (Signed)
Patient is aware and advised the patient that if she would like to start back doing the soaking instructions she can. Patient verbalized understanding of the instructions given.

## 2022-08-29 ENCOUNTER — Telehealth: Payer: Self-pay | Admitting: Podiatry

## 2022-08-29 ENCOUNTER — Other Ambulatory Visit: Payer: Self-pay | Admitting: Podiatry

## 2022-08-29 ENCOUNTER — Encounter: Payer: Self-pay | Admitting: Podiatry

## 2022-08-29 ENCOUNTER — Ambulatory Visit: Payer: HMO | Admitting: Podiatry

## 2022-08-29 DIAGNOSIS — L600 Ingrowing nail: Secondary | ICD-10-CM

## 2022-08-29 DIAGNOSIS — L603 Nail dystrophy: Secondary | ICD-10-CM | POA: Diagnosis not present

## 2022-08-29 MED ORDER — POVIDONE-IODINE 10 % EX SOLN: 1.0000 | CUTANEOUS | 0 refills | Status: AC | PRN

## 2022-08-29 NOTE — Progress Notes (Signed)
  Subjective:  Patient ID: ELMA SHANDS, female    DOB: May 08, 1961,   MRN: [id number]  Chief Complaint  Patient presents with   Nail Problem    Right ingrown toenail f/u     62 y.o. female presents for follow-up of bilateral hallux nail avulsions. Relates a few days ago she had some yellow drainage and additional antibiotics were called in for her. Here to recheck. .  . Denies any other pedal complaints. Denies n/v/f/c.   Past Medical History:  Diagnosis Date   Chronic diarrhea    Diabetes mellitus    Grave's disease     Objective:  Physical Exam: Vascular: DP/PT pulses 2/4 bilateral. CFT <3 seconds. Normal hair growth on digits. No edema.  Skin. No lacerations or abrasions bilateral feet. Bilateral hallux nail beds appear to be healing well. Left with some residual wound that needs to heal distally. Some mild erythema noted to the base of the nail bed. No pain to palpation. No purulence or edema noted. No signs of infection noted.  Musculoskeletal: MMT 5/5 bilateral lower extremities in DF, PF, Inversion and Eversion. Deceased ROM in DF of ankle joint.  Neurological: Sensation intact to light touch.   Assessment:   1. Ingrown right greater toenail   2. Ingrown left greater toenail   3. Dystrophic nail       Plan:  Patient was evaluated and treated and all questions answered. Toe was evaluated and appears to be healing well on right.  Left with some residual nail bed that needs to heel. Advised to keep betadine and bandaid during the day and open at night. .  Patient to follow-up in 2 weeks for recheck.    Louann Sjogren, DPM

## 2022-08-29 NOTE — Telephone Encounter (Signed)
Pt states she was told that a betadine liquid was to be sent to pharmacy and I don't see that on the med list. Can this be sent in?  [id number] - [place] [place] - [address]   Please advise.

## 2022-08-29 NOTE — Telephone Encounter (Signed)
Thank you :)

## 2022-09-04 ENCOUNTER — Telehealth: Payer: Self-pay | Admitting: *Deleted

## 2022-09-04 NOTE — Telephone Encounter (Signed)
I would say lets avoid any soaks for now it sounds like it may be a bit too wet. Lets just continue with the betadine daily to the toe and I will see her at her follow-up.

## 2022-09-04 NOTE — Telephone Encounter (Signed)
Patient is calling because her left toe has increased redness, pain. The oozing was doing well with  betadine soaks but has started to sting with the dial soaking, and there is a lot of white moisture were the scab had formed that she has  noticed.  She is using the antibacterial soap in the morning, drying with the cotton balls and applying the betadine, elevating for about an hour daily.What are the next steps moving forward? Please advise.

## 2022-09-04 NOTE — Telephone Encounter (Signed)
Patient has been updated,will call back if no improvements before upcoming appointment.

## 2022-09-13 ENCOUNTER — Encounter: Payer: Self-pay | Admitting: Podiatry

## 2022-09-13 ENCOUNTER — Ambulatory Visit: Payer: HMO | Admitting: Podiatry

## 2022-09-13 DIAGNOSIS — L600 Ingrowing nail: Secondary | ICD-10-CM | POA: Diagnosis not present

## 2022-09-13 NOTE — Progress Notes (Signed)
  Subjective:  Patient ID: Jody Cooke, female    DOB: 1960-10-30,   MRN: [id number]  Chief Complaint  Patient presents with   Nail Problem     2 week nail f/u     62 y.o. 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 Relates she has been using the betadine.  Here to recheck. .  . Denies any other pedal complaints. Denies n/v/f/c.   Past Medical History:  Diagnosis Date   Chronic diarrhea    Diabetes mellitus    Grave's disease     Objective:  Physical Exam: Vascular: DP/PT pulses 2/4 bilateral. CFT <3 seconds. Normal hair growth on digits. No edema.  Skin. No lacerations or abrasions bilateral feet. Bilateral hallux nail beds appear to be healing well. Left with some residual wound that needs to heal proximally No erythema or edema noted.  No pain to palpation. No purulence or edema noted. No signs of infection noted.  Musculoskeletal: MMT 5/5 bilateral lower extremities in DF, PF, Inversion and Eversion. Deceased ROM in DF of ankle joint.  Neurological: Sensation intact to light touch.   Assessment:   1. Ingrown right greater toenail   2. Ingrown left greater toenail        Plan:  Patient was evaluated and treated and all questions answered. Toe was evaluated and appears to be healing well on right.  Left with some residual nail bed that needs to heel. Advised to keep betadine and bandaid during the day and open at night. .  Patient to follow-up in 2 weeks for recheck.    Louann Sjogren, DPM

## 2022-09-16 IMAGING — CT CT HEAD W/O CM
3 series · 15 of 47 positions shown, 18 images · non-contrast
Comparison: None.

CLINICAL DATA: Vertigo/dizziness beginning tonight.

EXAM:
CT HEAD WITHOUT CONTRAST
TECHNIQUE: Contiguous axial images were obtained from the base of the skull
through the vertex without intravenous contrast.

[Series 2: head wo · axial · 0.42mm/px · z∈[+790,+920]mm · 9 of 32 slices shown, 12 images]
[im 3/32  brain]
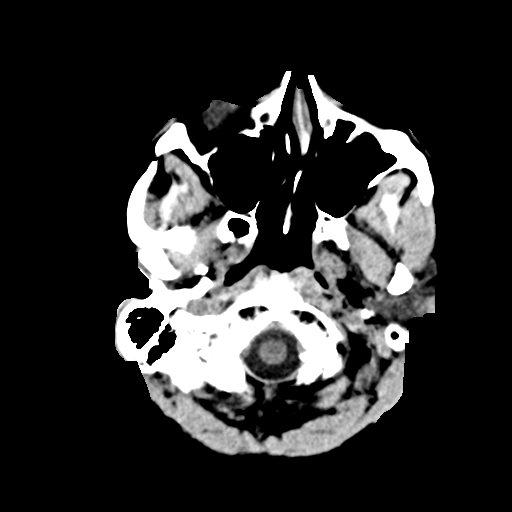
[im 3/32  bone]
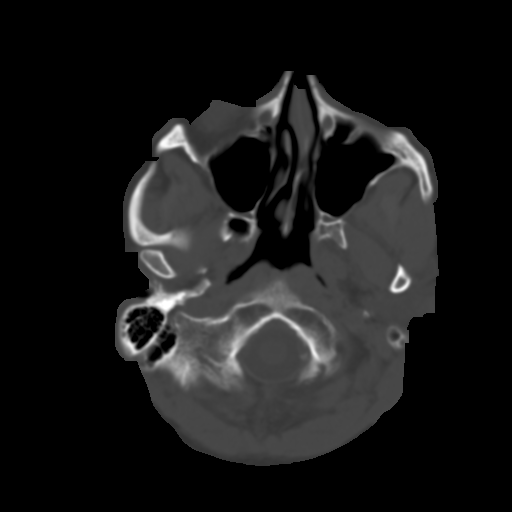
[im 6/32  brain]
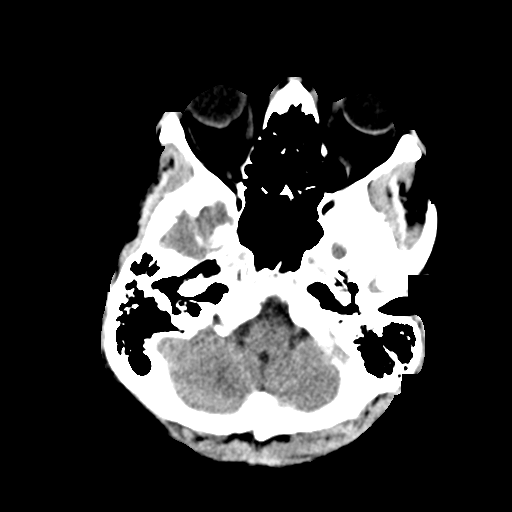
[im 9/32  brain]
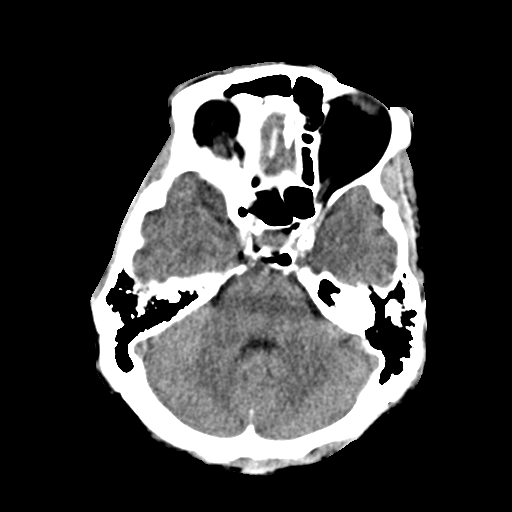
[im 12/32  brain]
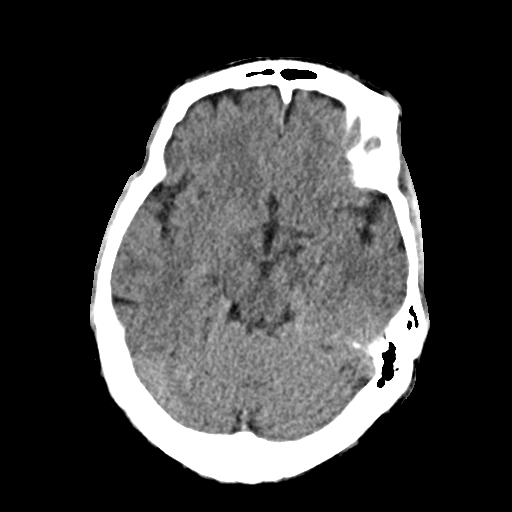
[im 17/32  brain]
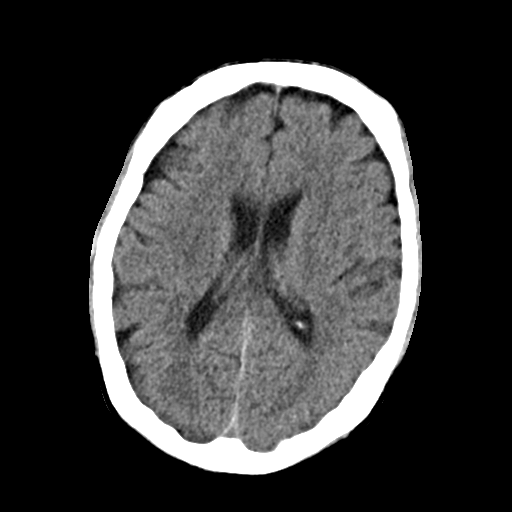
[im 17/32  bone]
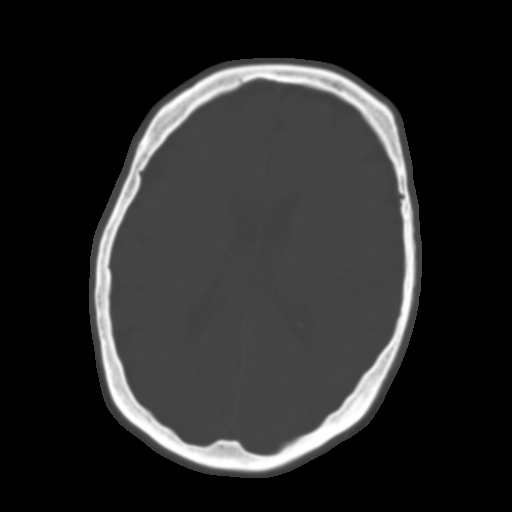
[im 20/32  brain]
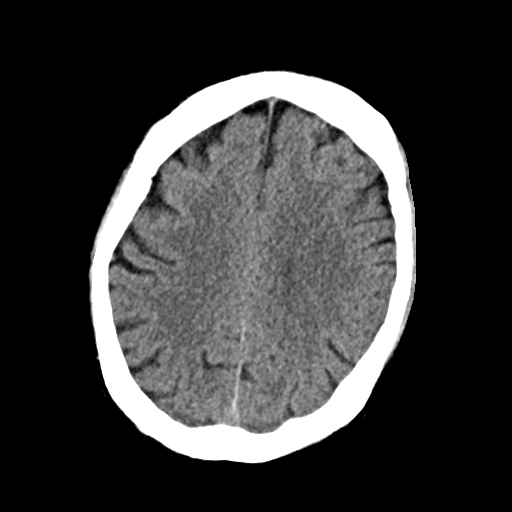
[im 23/32  brain]
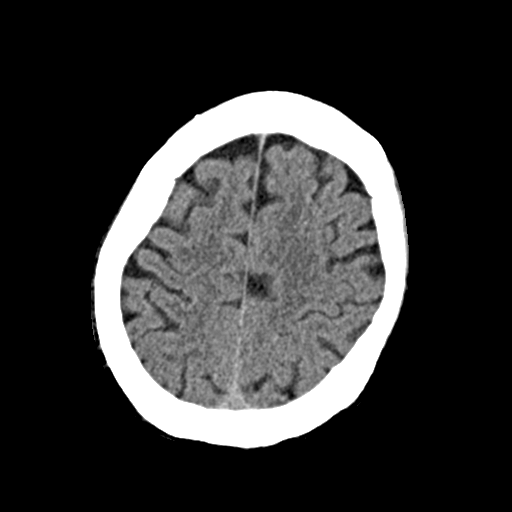
[im 26/32  brain]
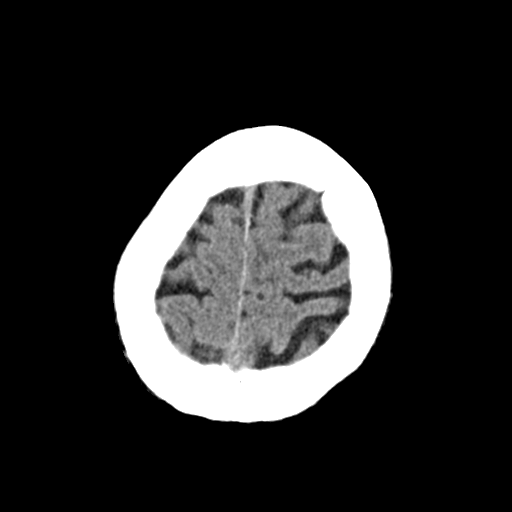
[im 29/32  brain]
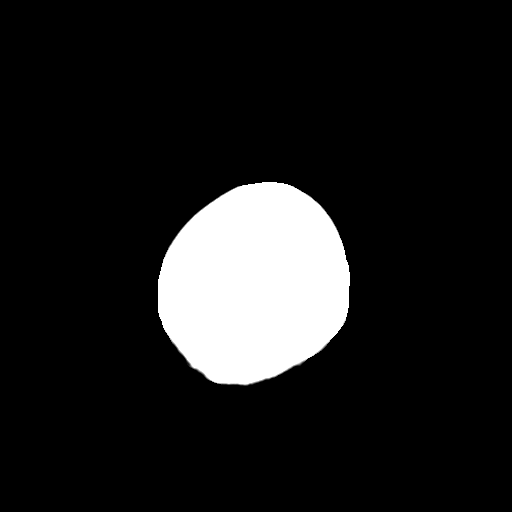
[im 29/32  bone]
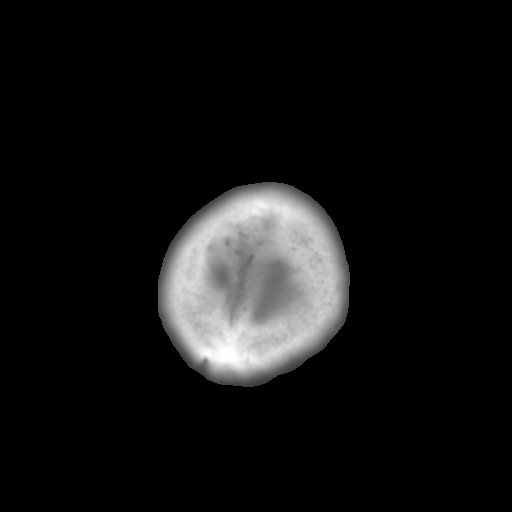

[Series 4: coronal soft · coronal · 0.31mm/px · 3 of 68 slices shown]
[im 23/68  brain]
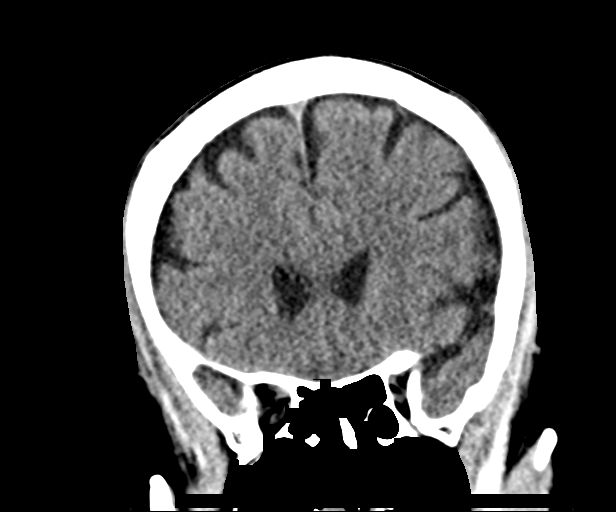
[im 30/68  brain]
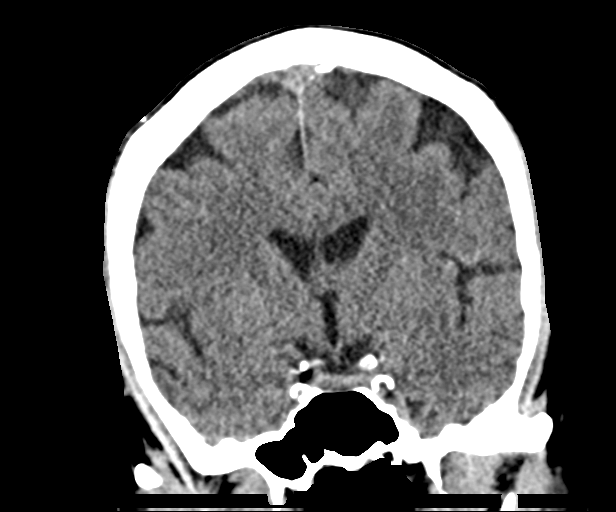
[im 38/68  brain]
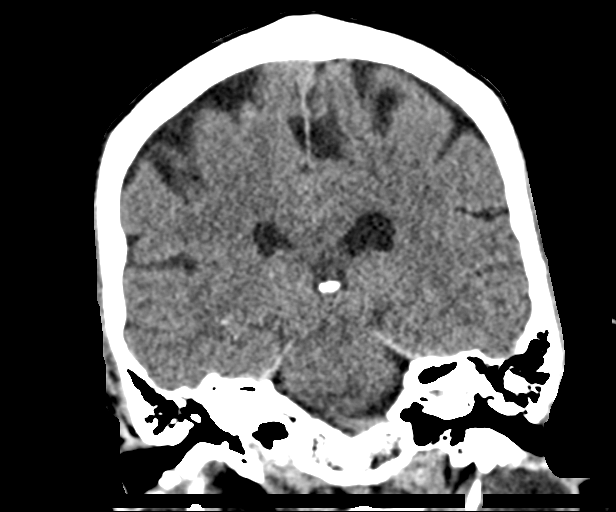

[Series 5: sag soft · sagittal · 0.32mm/px · 3 of 54 slices shown]
[im 18/54  brain]
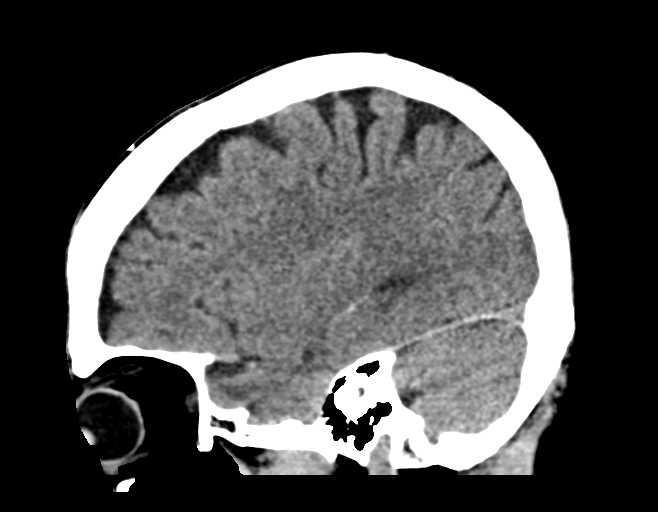
[im 27/54  brain]
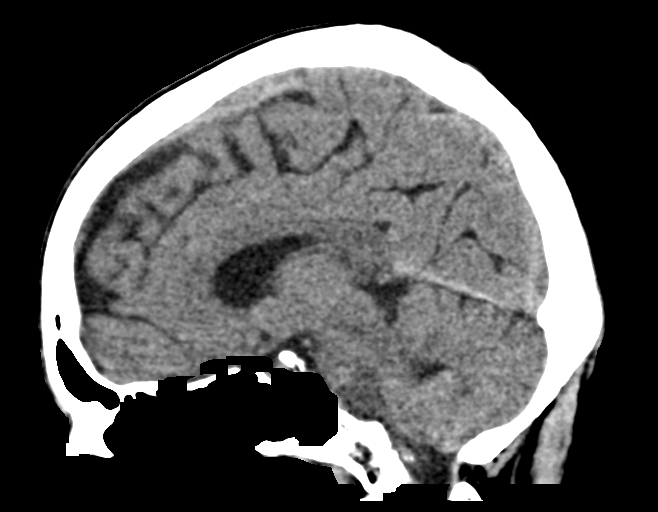
[im 36/54  brain]
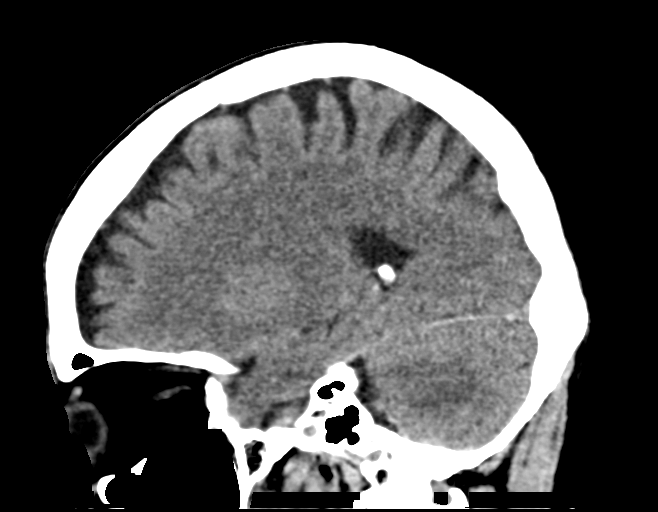

[15 of 47 positions shown; findings below may reference images not displayed]

FINDINGS: Brain: The brain shows a normal appearance without evidence of
malformation, atrophy, old or acute small or large vessel
infarction, mass lesion, hemorrhage, hydrocephalus or extra-axial
collection.

Vascular: No hyperdense vessel. No evidence of atherosclerotic
calcification.

Skull: Normal.  No traumatic finding.  No focal bone lesion.

Sinuses/Orbits: Sinuses are clear. Orbits appear normal. Mastoids
are clear.

Other: None significant
IMPRESSION: Normal head CT.

## 2022-09-27 ENCOUNTER — Encounter: Payer: Self-pay | Admitting: Podiatry

## 2022-09-27 ENCOUNTER — Ambulatory Visit: Payer: HMO | Admitting: Podiatry

## 2022-09-27 DIAGNOSIS — L600 Ingrowing nail: Secondary | ICD-10-CM

## 2022-09-27 NOTE — Progress Notes (Signed)
  Subjective:  Patient ID: Jody Cooke, female    DOB: 1961/02/23,   MRN: [id number]  Chief Complaint  Patient presents with   Nail Problem    Bilateral great toenails    62 y.o. 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 Relates she has been using the betadine.  Here to recheck. .  . Denies any other pedal complaints. Denies n/v/f/c.   Past Medical History:  Diagnosis Date   Chronic diarrhea    Diabetes mellitus    Grave's disease     Objective:  Physical Exam: Vascular: DP/PT pulses 2/4 bilateral. CFT <3 seconds. Normal hair growth on digits. No edema.  Skin. No lacerations or abrasions bilateral feet. Bilateral hallux nail beds appear to be healing well. Left with some residual wound that needs to heal proximally No erythema or edema noted.  No pain to palpation. No purulence or edema noted. No signs of infection noted.  Musculoskeletal: MMT 5/5 bilateral lower extremities in DF, PF, Inversion and Eversion. Deceased ROM in DF of ankle joint.  Neurological: Sensation intact to light touch.   Assessment:   1. Ingrown left greater toenail         Plan:  Patient was evaluated and treated and all questions answered. Toe was evaluated and appears to be healing well on right.  Left with some residual nail bed that needs to heel. Advised to keep betadine and bandaid during the day and open at night. .  Patient to follow-up in 2 weeks for recheck.    Louann Sjogren, DPM

## 2022-10-11 ENCOUNTER — Ambulatory Visit: Payer: HMO | Admitting: Podiatry

## 2022-10-11 DIAGNOSIS — L600 Ingrowing nail: Secondary | ICD-10-CM

## 2022-10-11 NOTE — Progress Notes (Signed)
  Subjective:  Patient ID: Jody Cooke, female    DOB: 1960/06/15,   MRN: [id number]  Chief Complaint  Patient presents with   Nail Problem    Left ingrown toenail still healing     62 y.o. 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 Relates she has been using the betadine.  Here to recheck. .  . Denies any other pedal complaints. Denies n/v/f/c.   Past Medical History:  Diagnosis Date   Chronic diarrhea    Diabetes mellitus    Grave's disease     Objective:  Physical Exam: Vascular: DP/PT pulses 2/4 bilateral. CFT <3 seconds. Normal hair growth on digits. No edema.  Skin. No lacerations or abrasions bilateral feet. Bilateral hallux nail beds appear to be healing well. Left with some residual wound that needs to heal proximally No erythema or edema noted.  No pain to palpation. No purulence or edema noted. No signs of infection noted. It is improving and nearly 100% healed.  Musculoskeletal: MMT 5/5 bilateral lower extremities in DF, PF, Inversion and Eversion. Deceased ROM in DF of ankle joint.  Neurological: Sensation intact to light touch.   Assessment:   1. Ingrown left greater toenail          Plan:  Patient was evaluated and treated and all questions answered. Toe was evaluated and appears to be healing well on right.  Left with some residual nail bed that needs to heel. Advised to keep betadine and bandaid during the day and open at night. .  Patient to follow-up in 2 weeks for recheck.    Louann Sjogren, DPM

## 2022-10-24 ENCOUNTER — Ambulatory Visit: Payer: HMO | Admitting: Podiatry
